# Patient Record
Sex: Male | Born: 1955 | Hispanic: Refuse to answer | Marital: Married | Smoking: Never smoker
Health system: Southern US, Community
[De-identification: ages and names within clinical notes are randomized; demographics above are authoritative.]

## PROBLEM LIST (undated history)

## (undated) DIAGNOSIS — E785 Hyperlipidemia, unspecified: Secondary | ICD-10-CM

## (undated) DIAGNOSIS — I10 Essential (primary) hypertension: Secondary | ICD-10-CM

## (undated) HISTORY — DX: Essential (primary) hypertension: I10

## (undated) HISTORY — PX: EXPLORATORY LAPAROTOMY: SUR591

## (undated) HISTORY — DX: Hyperlipidemia, unspecified: E78.5

## (undated) HISTORY — PX: REFRACTIVE SURGERY: SHX103

---

## 2002-12-26 ENCOUNTER — Encounter: Payer: Self-pay | Admitting: Family Medicine

## 2003-01-11 ENCOUNTER — Encounter: Payer: Self-pay | Admitting: Family Medicine

## 2006-08-05 ENCOUNTER — Ambulatory Visit: Payer: Self-pay | Admitting: Family Medicine

## 2006-09-05 ENCOUNTER — Ambulatory Visit: Payer: Self-pay | Admitting: Family Medicine

## 2006-09-05 LAB — CONVERTED CEMR LAB
Chol/HDL Ratio, serum: 4.8
Cholesterol: 172 mg/dL (ref 0–200)
Glucose, Bld: 102 mg/dL — ABNORMAL HIGH (ref 70–99)
HDL: 36.1 mg/dL — ABNORMAL LOW (ref >39.0)
LDL Cholesterol: 114 mg/dL — ABNORMAL HIGH (ref 0–99)
PSA: 1.7 ng/mL (ref 0.10–4.00)
Triglyceride fasting, serum: 107 mg/dL (ref 0–149)
VLDL: 21 mg/dL (ref 0–40)

## 2006-09-27 ENCOUNTER — Ambulatory Visit: Payer: Self-pay | Admitting: Internal Medicine

## 2006-11-02 LAB — HM COLONOSCOPY: HM Colonoscopy: NORMAL

## 2006-11-14 ENCOUNTER — Ambulatory Visit: Payer: Self-pay | Admitting: Internal Medicine

## 2006-12-12 ENCOUNTER — Ambulatory Visit: Payer: Self-pay | Admitting: Family Medicine

## 2006-12-12 LAB — CONVERTED CEMR LAB: Glucose, Bld: 103 mg/dL — ABNORMAL HIGH (ref 70–99)

## 2007-02-08 ENCOUNTER — Ambulatory Visit: Payer: Self-pay | Admitting: Family Medicine

## 2007-03-07 DIAGNOSIS — E785 Hyperlipidemia, unspecified: Secondary | ICD-10-CM | POA: Insufficient documentation

## 2007-03-15 ENCOUNTER — Ambulatory Visit: Payer: Self-pay | Admitting: Family Medicine

## 2007-03-15 LAB — CONVERTED CEMR LAB
ALT: 32 units/L (ref 0–40)
AST: 26 units/L (ref 0–37)
Albumin: 4.3 g/dL (ref 3.5–5.2)
Alkaline Phosphatase: 79 units/L (ref 39–117)
BUN: 10 mg/dL (ref 6–23)
Basophils Absolute: 0 10*3/uL (ref 0.0–0.1)
Basophils Relative: 0.2 % (ref 0.0–1.0)
Bilirubin, Direct: 0.1 mg/dL (ref 0.0–0.3)
CO2: 27 meq/L (ref 19–32)
Calcium: 9.5 mg/dL (ref 8.4–10.5)
Chloride: 107 meq/L (ref 96–112)
Creatinine, Ser: 0.9 mg/dL (ref 0.4–1.5)
Eosinophils Absolute: 0.1 10*3/uL (ref 0.0–0.6)
Eosinophils Relative: 2.2 % (ref 0.0–5.0)
GFR calc Af Amer: 116 mL/min
GFR calc non Af Amer: 96 mL/min
Glucose, Bld: 99 mg/dL (ref 70–99)
HCT: 43.6 % (ref 39.0–52.0)
Hemoglobin: 15.5 g/dL (ref 13.0–17.0)
Lymphocytes Relative: 30.4 % (ref 12.0–46.0)
MCHC: 35.5 g/dL (ref 30.0–36.0)
MCV: 91.2 fL (ref 78.0–100.0)
Magnesium: 2.3 mg/dL (ref 1.5–2.5)
Monocytes Absolute: 0.6 10*3/uL (ref 0.2–0.7)
Monocytes Relative: 8.9 % (ref 3.0–11.0)
Neutro Abs: 4 10*3/uL (ref 1.4–7.7)
Neutrophils Relative %: 58.3 % (ref 43.0–77.0)
Platelets: 235 10*3/uL (ref 150–400)
Potassium: 3.7 meq/L (ref 3.5–5.1)
RBC: 4.78 M/uL (ref 4.22–5.81)
RDW: 11.6 % (ref 11.5–14.6)
Sodium: 140 meq/L (ref 135–145)
TSH: 1.77 microintl units/mL (ref 0.35–5.50)
Total Bilirubin: 1 mg/dL (ref 0.3–1.2)
Total Protein: 7.5 g/dL (ref 6.0–8.3)
WBC: 6.8 10*3/uL (ref 4.5–10.5)

## 2007-06-14 ENCOUNTER — Encounter (INDEPENDENT_AMBULATORY_CARE_PROVIDER_SITE_OTHER): Payer: Self-pay | Admitting: Family Medicine

## 2007-10-09 ENCOUNTER — Telehealth (INDEPENDENT_AMBULATORY_CARE_PROVIDER_SITE_OTHER): Payer: Self-pay | Admitting: Family Medicine

## 2008-03-15 ENCOUNTER — Ambulatory Visit: Payer: Self-pay | Admitting: Internal Medicine

## 2008-03-15 DIAGNOSIS — S99919A Unspecified injury of unspecified ankle, initial encounter: Secondary | ICD-10-CM

## 2008-03-15 DIAGNOSIS — S8990XA Unspecified injury of unspecified lower leg, initial encounter: Secondary | ICD-10-CM | POA: Insufficient documentation

## 2008-03-15 DIAGNOSIS — S99929A Unspecified injury of unspecified foot, initial encounter: Secondary | ICD-10-CM

## 2008-07-02 ENCOUNTER — Ambulatory Visit: Payer: Self-pay | Admitting: Family Medicine

## 2008-07-02 DIAGNOSIS — I10 Essential (primary) hypertension: Secondary | ICD-10-CM | POA: Insufficient documentation

## 2008-07-15 ENCOUNTER — Encounter (INDEPENDENT_AMBULATORY_CARE_PROVIDER_SITE_OTHER): Payer: Self-pay | Admitting: *Deleted

## 2008-07-15 LAB — CONVERTED CEMR LAB
BUN: 18 mg/dL (ref 6–23)
CO2: 31 meq/L (ref 19–32)
Calcium: 9.7 mg/dL (ref 8.4–10.5)
Chloride: 109 meq/L (ref 96–112)
Creatinine, Ser: 0.9 mg/dL (ref 0.4–1.5)
GFR calc Af Amer: 115 mL/min
GFR calc non Af Amer: 95 mL/min
Glucose, Bld: 103 mg/dL — ABNORMAL HIGH (ref 70–99)
Potassium: 4.3 meq/L (ref 3.5–5.1)
Sodium: 142 meq/L (ref 135–145)

## 2008-07-18 ENCOUNTER — Ambulatory Visit: Payer: Self-pay | Admitting: Family Medicine

## 2008-07-29 LAB — CONVERTED CEMR LAB
ALT: 35 units/L (ref 0–53)
AST: 28 units/L (ref 0–37)
Alkaline Phosphatase: 71 units/L (ref 39–117)
Bilirubin, Direct: 0.1 mg/dL (ref 0.0–0.3)
CO2: 32 meq/L (ref 19–32)
Chloride: 104 meq/L (ref 96–112)
GFR calc non Af Amer: 84 mL/min
LDL Cholesterol: 107 mg/dL — ABNORMAL HIGH (ref 0–99)
Potassium: 4.6 meq/L (ref 3.5–5.1)
Total Bilirubin: 1.1 mg/dL (ref 0.3–1.2)
Total CHOL/HDL Ratio: 6.1
Triglycerides: 173 mg/dL — ABNORMAL HIGH (ref 0–149)

## 2008-07-30 ENCOUNTER — Encounter (INDEPENDENT_AMBULATORY_CARE_PROVIDER_SITE_OTHER): Payer: Self-pay | Admitting: *Deleted

## 2009-08-29 ENCOUNTER — Ambulatory Visit: Payer: Self-pay | Admitting: Family Medicine

## 2009-09-10 ENCOUNTER — Telehealth (INDEPENDENT_AMBULATORY_CARE_PROVIDER_SITE_OTHER): Payer: Self-pay | Admitting: *Deleted

## 2009-09-10 LAB — CONVERTED CEMR LAB
ALT: 39 units/L (ref 0–53)
AST: 29 units/L (ref 0–37)
BUN: 13 mg/dL (ref 6–23)
Basophils Relative: 0.4 % (ref 0.0–3.0)
Bilirubin, Direct: 0 mg/dL (ref 0.0–0.3)
Chloride: 103 meq/L (ref 96–112)
Eosinophils Relative: 1.5 % (ref 0.0–5.0)
GFR calc non Af Amer: 83.71 mL/min (ref >60)
HCT: 46.8 % (ref 39.0–52.0)
LDL Cholesterol: 114 mg/dL — ABNORMAL HIGH (ref 0–99)
Lymphs Abs: 2.3 10*3/uL (ref 0.7–4.0)
Monocytes Relative: 8.5 % (ref 3.0–12.0)
Platelets: 218 10*3/uL (ref 150.0–400.0)
Potassium: 4.6 meq/L (ref 3.5–5.1)
RBC: 4.9 M/uL (ref 4.22–5.81)
Sodium: 140 meq/L (ref 135–145)
Total Bilirubin: 1.1 mg/dL (ref 0.3–1.2)
Total CHOL/HDL Ratio: 6
Total Protein: 8.3 g/dL (ref 6.0–8.3)
VLDL: 37.6 mg/dL (ref 0.0–40.0)
WBC: 8.5 10*3/uL (ref 4.5–10.5)

## 2010-01-21 ENCOUNTER — Telehealth (INDEPENDENT_AMBULATORY_CARE_PROVIDER_SITE_OTHER): Payer: Self-pay | Admitting: *Deleted

## 2010-11-06 ENCOUNTER — Other Ambulatory Visit: Payer: Self-pay | Admitting: Family Medicine

## 2010-11-06 ENCOUNTER — Encounter: Payer: Self-pay | Admitting: Family Medicine

## 2010-11-06 ENCOUNTER — Ambulatory Visit
Admission: RE | Admit: 2010-11-06 | Discharge: 2010-11-06 | Payer: Self-pay | Source: Home / Self Care | Attending: Family Medicine | Admitting: Family Medicine

## 2010-11-06 DIAGNOSIS — N529 Male erectile dysfunction, unspecified: Secondary | ICD-10-CM | POA: Insufficient documentation

## 2010-11-06 LAB — CBC WITH DIFFERENTIAL/PLATELET
Basophils Absolute: 0 10*3/uL (ref 0.0–0.1)
Basophils Relative: 0.5 % (ref 0.0–3.0)
Eosinophils Absolute: 0.1 10*3/uL (ref 0.0–0.7)
Eosinophils Relative: 1.5 % (ref 0.0–5.0)
HCT: 46.3 % (ref 39.0–52.0)
Hemoglobin: 16.1 g/dL (ref 13.0–17.0)
Lymphocytes Relative: 26.9 % (ref 12.0–46.0)
Lymphs Abs: 2.3 10*3/uL (ref 0.7–4.0)
MCHC: 34.9 g/dL (ref 30.0–36.0)
MCV: 94.8 fl (ref 78.0–100.0)
Monocytes Absolute: 0.6 10*3/uL (ref 0.1–1.0)
Monocytes Relative: 7.1 % (ref 3.0–12.0)
Neutro Abs: 5.4 10*3/uL (ref 1.4–7.7)
Neutrophils Relative %: 64 % (ref 43.0–77.0)
Platelets: 211 10*3/uL (ref 150.0–400.0)
RBC: 4.88 Mil/uL (ref 4.22–5.81)
RDW: 13.2 % (ref 11.5–14.6)
WBC: 8.4 10*3/uL (ref 4.5–10.5)

## 2010-11-06 LAB — HEPATIC FUNCTION PANEL
ALT: 31 U/L (ref 0–53)
AST: 27 U/L (ref 0–37)
Albumin: 4.5 g/dL (ref 3.5–5.2)
Alkaline Phosphatase: 76 U/L (ref 39–117)
Bilirubin, Direct: 0.1 mg/dL (ref 0.0–0.3)
Total Bilirubin: 1 mg/dL (ref 0.3–1.2)
Total Protein: 7.7 g/dL (ref 6.0–8.3)

## 2010-11-06 LAB — CONVERTED CEMR LAB
Glucose, Urine, Semiquant: NEGATIVE
Protein, U semiquant: NEGATIVE
Urobilinogen, UA: NEGATIVE
WBC Urine, dipstick: NEGATIVE

## 2010-11-06 LAB — LIPID PANEL
Cholesterol: 191 mg/dL (ref 0–200)
HDL: 36 mg/dL — ABNORMAL LOW (ref >39.00)
LDL Cholesterol: 132 mg/dL — ABNORMAL HIGH (ref 0–99)
Total CHOL/HDL Ratio: 5
Triglycerides: 113 mg/dL (ref 0.0–149.0)
VLDL: 22.6 mg/dL (ref 0.0–40.0)

## 2010-11-06 LAB — BASIC METABOLIC PANEL
BUN: 12 mg/dL (ref 6–23)
CO2: 29 mEq/L (ref 19–32)
Calcium: 9.5 mg/dL (ref 8.4–10.5)
Chloride: 103 mEq/L (ref 96–112)
Creatinine, Ser: 0.9 mg/dL (ref 0.4–1.5)
GFR: 97.85 mL/min (ref >60.00)
Glucose, Bld: 90 mg/dL (ref 70–99)
Potassium: 4.5 mEq/L (ref 3.5–5.1)
Sodium: 139 mEq/L (ref 135–145)

## 2010-11-06 LAB — PSA: PSA: 1.83 ng/mL (ref 0.10–4.00)

## 2010-11-06 LAB — TSH: TSH: 1.79 u[IU]/mL (ref 0.35–5.50)

## 2010-11-07 ENCOUNTER — Encounter: Payer: Self-pay | Admitting: Family Medicine

## 2010-11-10 LAB — CONVERTED CEMR LAB: Testosterone-% Free: 2.5 % (ref 1.6–2.9)

## 2010-11-17 NOTE — Progress Notes (Signed)
Summary: Need copy of bloodwork  Phone Note Call from Patient Call back at Home Phone [phone]   Caller: Joseph [name] Call For: [name] DO Summary of Call: Wants a copy of bloodwork.  Initial call taken by: Barnie Mort,  January 21, 2010 8:45 AM  Follow-up for Phone Call        Placed up front for pick up. Follow-up by: Harold Barban,  January 21, 2010 8:53 AM

## 2010-11-19 NOTE — Assessment & Plan Note (Signed)
Summary: cpx//pt will be fasting//lch   Vital Signs:  Patient profile:   55 year old male Height:      72 inches Weight:      261.0 pounds BMI:     35.53 Pulse rate:   88 / minute Pulse rhythm:   regular BP sitting:   120 / 84  (right arm) Cuff size:   large  Vitals Entered By: Almeta Monas CMA Duncan Dull) (November 06, 2010 9:56 AM) CC: CPX/'fasting   History of Present Illness: Pt here for cpe.    Hyperlipidemia follow-up      This is a 55 year old man who presents for Hyperlipidemia follow-up.  The patient denies muscle aches, GI upset, abdominal pain, flushing, itching, constipation, diarrhea, and fatigue.  The patient denies the following symptoms: chest pain/pressure, exercise intolerance, dypsnea, palpitations, syncope, and pedal edema.  Compliance with medications (by patient report) has been near 100%.  Dietary compliance has been good.  The patient reports exercising daily.  Adjunctive measures currently used by the patient include ASA.    Hypertension follow-up      The patient also presents for Hypertension follow-up.  The patient denies lightheadedness, urinary frequency, headaches, edema, impotence, rash, and fatigue.  The patient denies the following associated symptoms: chest pain, chest pressure, exercise intolerance, dyspnea, palpitations, syncope, leg edema, and pedal edema.  Compliance with medications (by patient report) has been near 100%.  The patient reports that dietary compliance has been good.  The patient reports exercising daily.  Adjunctive measures currently used by the patient include salt restriction.    Preventive Screening-Counseling & Management  Alcohol-Tobacco     Alcohol drinks/day: 2     Alcohol type: WINE,BEER,MIXED DRINKS     Smoking Status: never  Caffeine-Diet-Exercise     Does Patient Exercise: yes     Type of exercise: cardio, weights     Exercise (avg: min/session): 30-60     Times/week: 5  Hep-HIV-STD-Contraception     Dental  Visit-last 6 months no     Dental Care Counseling: not indicated; dental care within six months      Sexual History:  currently monogamous.        Drug Use:  no.    Current Medications (verified): 1)  Lisinopril 20 Mg Tabs (Lisinopril) .Marland Kitchen.. 1 By Mouth Once Daily 2)  Cialis 5 Mg Tabs (Tadalafil) .Marland Kitchen.. 1 By Mouth Daily.  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 Family History of Colon CA 1st degree relative  81s Father-- pancreatic cancer Family History of Arthritis  Social History: Reviewed history and no changes required. 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 Eyes:  Denies  blurring, discharge, double vision, eye irritation, eye pain, halos, itching, light sensitivity, red eye, vision loss-1 eye, and vision loss-both eyes. ENT:  Denies decreased hearing, difficulty swallowing, ear discharge, earache, hoarseness, nasal congestion, nosebleeds, postnasal drainage, ringing in ears, sinus pressure, and  sore throat. CV:  Denies bluish discoloration of lips or nails, chest pain or discomfort, difficulty breathing at night, difficulty breathing while lying down, fainting, fatigue, leg cramps with exertion, lightheadness, near fainting, palpitations, shortness of breath with exertion, swelling of feet, swelling of hands, and weight gain. Resp:  Denies chest discomfort, chest pain with inspiration, cough, coughing up blood, excessive snoring, hypersomnolence, morning headaches, pleuritic, shortness of breath, sputum productive, and wheezing. GI:  Denies abdominal pain, bloody stools, change in bowel habits, constipation, dark tarry stools, diarrhea, excessive appetite, gas, hemorrhoids, indigestion, loss of appetite, nausea, vomiting, vomiting blood, and yellowish skin color. GU:  Denies decreased libido, discharge, dysuria, erectile dysfunction, genital sores, hematuria, incontinence, nocturia, urinary frequency, and urinary hesitancy. MS:  Denies joint pain, joint redness, joint swelling, loss of strength, low back pain, mid back pain, muscle aches, muscle , cramps, muscle weakness, stiffness, and thoracic pain. Derm:  Denies changes in color of skin, changes in nail beds, dryness, excessive perspiration, flushing, hair loss, insect bite(s), itching, lesion(s), poor wound healing, and rash. Neuro:  Denies brief paralysis, difficulty with concentration, disturbances in coordination, falling down, headaches, inability to speak, memory loss, numbness, poor balance, seizures, sensation of room spinning, tingling, tremors, visual disturbances, and weakness. Psych:  Denies alternate hallucination ( auditory/visual), anxiety, depression, easily angered, easily tearful, irritability, mental problems, panic attacks, sense of great danger, suicidal thoughts/plans, thoughts of violence, unusual visions or sounds, and thoughts /plans of harming others. Endo:  Denies cold intolerance, excessive hunger, excessive thirst,  excessive urination, heat intolerance, polyuria, and weight change. Heme:  Denies abnormal bruising, bleeding, enlarge lymph nodes, fevers, pallor, and skin discoloration. Allergy:  Denies hives or rash, itching eyes, persistent infections, seasonal allergies, and sneezing.  Physical Exam  General:  Well-developed,well-nourished,in no acute distress; alert,appropriate and cooperative throughout examination Head:  Normocephalic and atraumatic without obvious abnormalities. No apparent alopecia or balding. Eyes:  pupils equal, pupils round, pupils reactive to light, and no injection.   Ears:  External ear exam shows no significant lesions or deformities.  Otoscopic examination reveals clear canals, tympanic membranes are intact bilaterally without bulging, retraction, inflammation or discharge. Hearing is grossly normal bilaterally. Nose:  External nasal examination shows no deformity or inflammation. Nasal mucosa are pink and moist without lesions or exudates. Mouth:  Oral mucosa and oropharynx without lesions or exudates.  Teeth in good repair. Neck:  No deformities, masses, or tenderness noted.no carotid bruits.   Chest Wall:  No deformities, masses, tenderness or gynecomastia noted. Lungs:  Normal respiratory effort, chest expands symmetrically. Lungs are clear to auscultation, no crackles or wheezes. Heart:  normal rate and no murmur.   Abdomen:  Bowel sounds positive,abdomen soft and non-tender without masses, organomegaly or hernias noted. Rectal:  No external abnormalities noted. Normal sphincter tone. No rectal masses or tenderness.  heme neg brown stool Genitalia:  Testes bilaterally descended without nodularity, tenderness or masses. No scrotal masses or lesions. No penis lesions or urethral discharge. Prostate:  Prostate gland firm and smooth, no enlargement, nodularity, tenderness, mass, asymmetry or induration. Msk:  No deformity or scoliosis noted of thoracic or lumbar spine.     Pulses:  R and L carotid,radial,femoral,dorsalis pedis and posterior tibial pulses are full and equal  bilaterally Extremities:  No clubbing, cyanosis, edema, or deformity noted with normal full range of motion of all joints.   Neurologic:  No cranial nerve deficits noted. Station and gait are normal. Plantar reflexes are down-going bilaterally. DTRs are symmetrical throughout. Sensory, motor and coordinative functions appear intact. Skin:  Intact without suspicious lesions or rashes Cervical Nodes:  No lymphadenopathy noted Axillary Nodes:  No palpable lymphadenopathy Psych:  Cognition and judgment appear intact. Alert and cooperative with normal attention span and concentration. No apparent delusions, illusions, hallucinations   Impression & Recommendations:  Problem # 1:  PREVENTIVE HEALTH CARE (ICD-V70.0) ghm utd  Orders: Venipuncture (16109) TLB-Lipid Panel (80061-LIPID) TLB-BMP (Basic Metabolic Panel-BMET) (80048-METABOL) TLB-CBC Platelet - w/Differential (85025-CBCD) TLB-Hepatic/Liver Function Pnl (80076-HEPATIC) TLB-TSH (Thyroid Stimulating Hormone) (84443-TSH) TLB-PSA (Prostate Specific Antigen) (84153-PSA) T- * Misc. Laboratory test [id number]) Specimen Handling (09811) EKG w/ Interpretation (93000)  Reviewed preventive care protocols, scheduled due services, and updated immunizations.  Problem # 2:  ERECTILE DYSFUNCTION, ORGANIC (ICD-607.84)  His updated medication list for this problem includes:    Cialis 5 Mg Tabs (Tadalafil) .Marland Kitchen... 1 by mouth daily.  Orders: Venipuncture (91478) TLB-Lipid Panel (80061-LIPID) TLB-BMP (Basic Metabolic Panel-BMET) (80048-METABOL) TLB-CBC Platelet - w/Differential (85025-CBCD) TLB-Hepatic/Liver Function Pnl (80076-HEPATIC) TLB-TSH (Thyroid Stimulating Hormone) (84443-TSH) TLB-PSA (Prostate Specific Antigen) (84153-PSA) T- * Misc. Laboratory test [id number]) Specimen Handling (13086)  Discussed proper use of medications, as well as side  effects.   Problem # 3:  HYPERTENSION (ICD-401.9)  His updated medication list for this problem includes:    Lisinopril 20 Mg Tabs (Lisinopril) .Marland Kitchen... 1 by mouth once daily  Orders: Venipuncture (57846) TLB-Lipid Panel (80061-LIPID) TLB-BMP (Basic Metabolic Panel-BMET) (80048-METABOL) TLB-CBC Platelet - w/Differential (85025-CBCD) TLB-Hepatic/Liver Function Pnl (80076-HEPATIC) TLB-TSH (Thyroid Stimulating Hormone) (84443-TSH) TLB-PSA (Prostate Specific Antigen) (84153-PSA) T- * Misc. Laboratory test [phone]) Specimen Handling (28413)  BP today: 120/84 Prior BP: 122/82 (08/29/2009)  Labs Reviewed: K+: 4.6 (08/29/2009) Creat: : 1.0 (08/29/2009)   Chol: 182 (08/29/2009)   HDL: 30.50 (08/29/2009)   LDL: 114 (08/29/2009)   TG: 188.0 (08/29/2009)  Problem # 4:  HYPERLIPIDEMIA (ICD-272.4)  The following medications were removed from the medication list:    Antara 130 Mg Caps (Fenofibrate micronized) .Marland Kitchen... 1 by mouth at bedtime.  Orders: Venipuncture (24401) TLB-Lipid Panel (80061-LIPID) TLB-BMP (Basic Metabolic Panel-BMET) (80048-METABOL) TLB-CBC Platelet - w/Differential (85025-CBCD) TLB-Hepatic/Liver Function Pnl (80076-HEPATIC) TLB-TSH (Thyroid Stimulating Hormone) (84443-TSH) TLB-PSA (Prostate Specific Antigen) (84153-PSA) T- * Misc. Laboratory test [phone]) Specimen Handling (36644)  Labs Reviewed: SGOT: 29 (08/29/2009)   SGPT: 39 (08/29/2009)   HDL:30.50 (08/29/2009), 27.8 (07/18/2008)  LDL:114 (08/29/2009), 107 (07/18/2008)  Chol:182 (08/29/2009), 169 (07/18/2008)  Trig:188.0 (08/29/2009), 173 (07/18/2008)  Complete Medication List: 1)  Lisinopril 20 Mg Tabs (Lisinopril) .Marland Kitchen.. 1 by mouth once daily 2)  Cialis 5 Mg Tabs (Tadalafil) .Marland Kitchen.. 1 by mouth daily.  Other Orders: Tdap => 7yrs IM (03474) Admin 1st Vaccine (25956) UA Dipstick W/ Micro (manual) (81000) T-Culture, Urine (38756-43329)   Orders Added: 1)  Tdap => 56yrs IM [90715] 2)  Admin 1st Vaccine [90471] 3)   Venipuncture [36415] 4)  TLB-Lipid Panel [80061-LIPID] 5)  TLB-BMP (Basic Metabolic Panel-BMET) [80048-METABOL] 6)  TLB-CBC Platelet - w/Differential [85025-CBCD] 7)  TLB-Hepatic/Liver Function Pnl [80076-HEPATIC] 8)  TLB-TSH (Thyroid Stimulating Hormone) [84443-TSH] 9)  TLB-PSA (Prostate Specific Antigen) [84153-PSA] 10)  T- * Misc. Laboratory test [phone] 11)  Specimen Handling [99000] 12)  UA Dipstick W/ Micro (manual) [81000] 13)  T-Culture, Urine [16606-30160] 14)  Est. Patient 40-64 years [  99396] 15)  EKG w/ Interpretation [93000]   Immunizations Administered:  Tetanus Vaccine:    Vaccine Type: Tdap    Site: left deltoid    Mfr: Merck    Dose: 0.5 ml    Route: IM    Given by: Almeta Monas CMA (AAMA)    Exp. Date: 08/06/2012    Lot #: [id number]    VIS given: 09/04/08 version given November 06, 2010.   Immunizations Administered:  Tetanus Vaccine:    Vaccine Type: Tdap    Site: left deltoid    Mfr: Merck    Dose: 0.5 ml    Route: IM    Given by: Almeta Monas CMA (AAMA)    Exp. Date: 08/06/2012    Lot #: [id number]    VIS given: 09/04/08 version given November 06, 2010.   Colonoscopy Result Date:  11/02/2006 Colonoscopy Result:  normal Colonoscopy Next Due:  10 yr  Laboratory Results   Urine Tests   Date/Time Reported: November 06, 2010 10:56 AM   Routine Urinalysis   Color: yellow Appearance: Clear Glucose: negative   (Normal Range: Negative) Bilirubin: negative   (Normal Range: Negative) Ketone: negative   (Normal Range: Negative) Spec. Gravity: <1.005   (Normal Range: 1.003-1.035) Blood: trace-intact   (Normal Range: Negative) pH: 6.5   (Normal Range: 5.0-8.0) Protein: negative   (Normal Range: Negative) Urobilinogen: negative   (Normal Range: 0-1) Nitrite: negative   (Normal Range: Negative) Leukocyte Esterace: negative   (Normal Range: Negative)    Comments: Floydene Flock  November 06, 2010 10:56 AM cx sent

## 2010-11-24 ENCOUNTER — Encounter (INDEPENDENT_AMBULATORY_CARE_PROVIDER_SITE_OTHER): Payer: Self-pay | Admitting: *Deleted

## 2010-11-25 ENCOUNTER — Encounter (INDEPENDENT_AMBULATORY_CARE_PROVIDER_SITE_OTHER): Payer: Self-pay | Admitting: *Deleted

## 2010-11-25 ENCOUNTER — Other Ambulatory Visit: Payer: Self-pay

## 2010-11-25 ENCOUNTER — Other Ambulatory Visit: Payer: Self-pay | Admitting: Family Medicine

## 2010-11-25 DIAGNOSIS — Z1211 Encounter for screening for malignant neoplasm of colon: Secondary | ICD-10-CM

## 2010-11-25 LAB — FECAL OCCULT BLOOD, IMMUNOCHEMICAL: Fecal Occult Bld: NEGATIVE

## 2010-12-03 NOTE — Letter (Signed)
Summary: Medication list/[facility]  Medication list/[facility]   Imported By: Sherian Rein 11/24/2010 15:18:56  _____________________________________________________________________  External Attachment:    Type:   Image     Comment:   External Document

## 2010-12-03 NOTE — Letter (Signed)
Summary: [facility]: Immunoassay Fecal Occult Blood (iFOB) Order Form  [facility] [place]  [address] [place], [place] [address]   Phone: [phone]  Fax: [phone]      [facility]: Immunoassay Fecal Occult Blood (iFOB) Order Form   November 24, 2010 MRN: [id number]   Joseph [name] Apr 01, 1956   Physicican Name: DR.LOWNE  Diagnosis Code: V56.71      Almeta Monas CMA (AAMA)

## 2010-12-03 NOTE — Letter (Signed)
Summary: [facility]   Imported By: Sherian Rein 11/24/2010 15:16:48  _____________________________________________________________________  External Attachment:    Type:   Image     Comment:   External Document

## 2010-12-03 NOTE — Letter (Signed)
Summary: [facility]   Imported By: Sherian Rein 11/24/2010 15:17:44  _____________________________________________________________________  External Attachment:    Type:   Image     Comment:   External Document

## 2010-12-07 ENCOUNTER — Other Ambulatory Visit: Payer: Self-pay | Admitting: Family Medicine

## 2010-12-07 ENCOUNTER — Observation Stay (HOSPITAL_COMMUNITY)
Admission: EM | Admit: 2010-12-07 | Discharge: 2010-12-08 | DRG: 883 | Disposition: A | Discharge status: Home / Self Care | Payer: BC Managed Care – PPO | Attending: Surgery | Admitting: Surgery

## 2010-12-07 ENCOUNTER — Encounter: Payer: Self-pay | Admitting: Family Medicine

## 2010-12-07 ENCOUNTER — Ambulatory Visit (INDEPENDENT_AMBULATORY_CARE_PROVIDER_SITE_OTHER)
Admission: RE | Admit: 2010-12-07 | Discharge: 2010-12-07 | Disposition: A | Discharge status: Home / Self Care | Payer: BC Managed Care – PPO | Source: Ambulatory Visit | Attending: Family Medicine | Admitting: Family Medicine

## 2010-12-07 ENCOUNTER — Ambulatory Visit (INDEPENDENT_AMBULATORY_CARE_PROVIDER_SITE_OTHER): Payer: BC Managed Care – PPO | Admitting: Family Medicine

## 2010-12-07 ENCOUNTER — Other Ambulatory Visit: Payer: Self-pay | Admitting: Surgery

## 2010-12-07 DIAGNOSIS — R1031 Right lower quadrant pain: Secondary | ICD-10-CM | POA: Insufficient documentation

## 2010-12-07 DIAGNOSIS — Z79899 Other long term (current) drug therapy: Secondary | ICD-10-CM | POA: Insufficient documentation

## 2010-12-07 DIAGNOSIS — I10 Essential (primary) hypertension: Secondary | ICD-10-CM | POA: Diagnosis present

## 2010-12-07 DIAGNOSIS — R319 Hematuria, unspecified: Secondary | ICD-10-CM

## 2010-12-07 DIAGNOSIS — E785 Hyperlipidemia, unspecified: Secondary | ICD-10-CM | POA: Diagnosis present

## 2010-12-07 DIAGNOSIS — K3580 Unspecified acute appendicitis: Principal | ICD-10-CM | POA: Diagnosis present

## 2010-12-07 LAB — CONVERTED CEMR LAB
HCT: 49.3 % (ref 39.0–52.0)
Hemoglobin: 15.5 g/dL (ref 13.0–17.0)
Lymphs Abs: 2.3 10*3/uL (ref 0.7–4.0)
MCV: 94.6 fL (ref 78.0–100.0)
Monocytes Absolute: 0.6 10*3/uL (ref 0.1–1.0)
Platelets: 228 10*3/uL (ref 150–400)
Protein, U semiquant: 100
Specific Gravity, Urine: 1.025
WBC: 8.9 10*3/uL (ref 4.0–10.5)
pH: 5

## 2010-12-07 LAB — PROTIME-INR: Prothrombin Time: 14.9 seconds (ref 11.6–15.2)

## 2010-12-07 LAB — HEPATIC FUNCTION PANEL
Albumin: 4.3 g/dL (ref 3.5–5.2)
Total Protein: 7.5 g/dL (ref 6.0–8.3)

## 2010-12-07 LAB — BASIC METABOLIC PANEL
CO2: 29 mEq/L (ref 19–32)
Calcium: 9.6 mg/dL (ref 8.4–10.5)
Creatinine, Ser: 1 mg/dL (ref 0.4–1.5)
Glucose, Bld: 83 mg/dL (ref 70–99)

## 2010-12-07 LAB — LIPASE: Lipase: 19 U/L (ref 11.0–59.0)

## 2010-12-07 MED ORDER — IOHEXOL 300 MG/ML  SOLN
100.0000 mL | Freq: Once | INTRAMUSCULAR | Status: AC | PRN
  Administered 2010-12-07: 100 mL via INTRAVENOUS

## 2010-12-15 NOTE — Assessment & Plan Note (Signed)
Summary: RLQ PAIN SINCE SATURDAY/RH...   Vital Signs:  Patient profile:   55 year old male Weight:      250.2 pounds Temp:     98.6 degrees F oral BP sitting:   130 / 82  (right arm) Cuff size:   large  Vitals Entered By: Almeta Monas CMA Duncan Dull) (December 07, 2010 11:23 AM) CC: C/O RLQ PAIN X2DAYS, Abdominal Pain   History of Present Illness:       This is a 55 year old man who presents with Abdominal Pain.  The symptoms began 3 days ago.  The patient denies nausea, vomiting, diarrhea, constipation, melena, hematochezia, anorexia, and hematemesis.  The location of the pain is right lower quadrant.  The pain is described as constant and sharp.  Associated symptoms include fever.  The patient denies the following symptoms: weight loss, dysuria, chest pain, jaundice, and dark urine.  The pain is worse with food and movement.    Problems Prior to Update: 1)  Abdominal Pain Right Lower Quadrant  (ICD-789.03) 2)  Hematuria Unspecified  (ICD-599.70) 3)  Erectile Dysfunction, Organic  (ICD-607.84) 4)  Preventive Health Care  (ICD-V70.0) 5)  Family History of Colon Ca 1st Degree Relative <60  (ICD-V16.0) 6)  Hypertension  (ICD-401.9) 7)  Foot Injury, Right  (ICD-959.7) 8)  Hyperlipidemia  (ICD-272.4)  Medications Prior to Update: 1)  Lisinopril 20 Mg Tabs (Lisinopril) .Marland Kitchen.. 1 By Mouth Once Daily 2)  Cialis 5 Mg Tabs (Tadalafil) .Marland Kitchen.. 1 By Mouth Daily.  Current Medications (verified): 1)  Lisinopril 20 Mg Tabs (Lisinopril) .Marland Kitchen.. 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 Family History of Colon CA 1st degree relative  63s Father-- pancreatic cancer Family History of Arthritis  Social History: Reviewed history from 11/06/2010 and no changes required. 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   Mouth:  Oral mucosa and oropharynx without lesions or exudates.   Neck:  No deformities, masses, or tenderness noted. Lungs:  Normal respiratory effort, chest expands symmetrically. Lungs are clear to auscultation, no crackles or wheezes. Heart:  normal rate and no murmur.   Abdomen:  + guarding RLQ with rebound some tenderness RUQ as well abdominal scar(s).   Psych:  Oriented X3 and normally interactive.     Impression & Recommendations:  Problem # 1:  ABDOMINAL PAIN RIGHT LOWER QUADRANT (ICD-789.03)  Orders: Venipuncture (57846) TLB-BMP (Basic Metabolic Panel-BMET) (80048-METABOL) TLB-Hepatic/Liver Function Pnl (80076-HEPATIC) TLB-CBC Platelet - w/Differential (85025-CBCD) TLB-Amylase (82150-AMYL) TLB-Lipase (83690-LIPASE) Specimen Handling (96295) Radiology Referral (Radiology) UA Dipstick w/o Micro (manual) (28413)  Discussed symptom control with the patient.   Problem # 2:  HEMATURIA UNSPECIFIED (ICD-599.70)  Orders: T-Culture, Urine (24401-02725) UA Dipstick w/o Micro (  manual) (24401)  Discussed medication use and hematuria work-up.   Complete  Medication List: 1)  Lisinopril 20 Mg Tabs (Lisinopril) .Marland Kitchen.. 1 by mouth once daily  Patient Instructions: 1)  if symptoms worsen go to ER   Orders Added: 1)  T-Culture, Urine [02725-36644] 2)  Venipuncture [03474] 3)  TLB-BMP (Basic Metabolic Panel-BMET) [80048-METABOL] 4)  TLB-Hepatic/Liver Function Pnl [80076-HEPATIC] 5)  TLB-CBC Platelet - w/Differential [85025-CBCD] 6)  TLB-Amylase [82150-AMYL] 7)  TLB-Lipase [83690-LIPASE] 8)  Specimen Handling [99000] 9)  Radiology Referral [Radiology] 10)  Est. Patient Level IV [25956] 11)  UA Dipstick w/o Micro (manual) [81002]    Laboratory Results   Urine Tests    Routine Urinalysis   Color: yellow Appearance: Hazy Glucose: negative   (Normal Range: Negative) Bilirubin: negative   (Normal Range: Negative) Ketone: >= (160)   (Normal Range: Negative) Spec. Gravity: 1.025   (Normal Range: 1.003-1.035) Blood: large   (Normal Range: Negative) pH: 5.0   (Normal Range: 5.0-8.0) Protein: 100   (Normal Range: Negative) Urobilinogen: 1.0   (Normal Range: 0-1) Nitrite: negative   (Normal Range: Negative) Leukocyte Esterace: negative   (Normal Range: Negative)

## 2010-12-24 NOTE — H&P (Signed)
Joseph [name], Joseph [name]                 ACCOUNT NO.:  [id number]  MEDICAL RECORD NO.:  [id number]           PATIENT TYPE:  I  LOCATION:  1528                         FACILITY[facility]  PHYSICIAN:  Almond Lint, MD       DATE OF BIRTH:  1956-06-19  DATE OF ADMISSION:  12/07/2010 DATE OF DISCHARGE:                             HISTORY & PHYSICAL   CHIEF COMPLAINT:  Abdominal pain.  HISTORY OF PRESENT ILLNESS:  Joseph [name] is a 55 year old male with around 48 hours of abdominal pain. He states it came on suddenly in the right lower quadrant and he thought it was something he ate.  However, it did not get any better over the last 2 days and he went to his primary care doctor today.  She ordered scans and labs, which were positive for acute appendicitis.  He describes the pain as being worse with movement and with food, although he was able to eat some food on Sunday.  He has not had his normal appetite.  He denies jaundice, chest pain, or shortness of breath.  No nausea.  No vomiting, diarrhea, or constipation.  The only relieving factor was palm wine last night that allowed him to go to sleep.  Pain now is 5/10.  PAST MEDICAL HISTORY: 1. Hypertension. 2. Hyperlipidemia.  PAST SURGICAL HISTORY:  Exploratory laparotomy for liver laceration in high school, reconstructive surgery on the right index finger.  FAMILY HISTORY:  Pancreatic cancer in his father, first-degree relative with colon cancer and arthritis.  SOCIAL HISTORY:  No tobacco.  Once a day alcohol in the form of a glass of wine a day.  No drugs.  He is married and accompanied by his wife. 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  REVIEW OF SYSTEMS:  Negative x11 systems.  MEDICATIONS:  Lisinopril.  ALLERGIES:  None.  PHYSICAL EXAMINATION:  VITAL SIGNS:  Temperature 98.3, heart rate 92, blood pressure 125/88, respiratory rate 16, 213 pounds, sats 98% on room air. GENERAL:  Alert and oriented x3, in no acute distress. HEENT:   Normocephalic, atraumatic.  Sclerae anicteric. NECK:  Supple.  No lymphadenopathy.  No thyromegaly.  Trachea is midline. HEART:  Regular rate and rhythm without murmurs. LUNGS:  Clear bilaterally. ABDOMEN:  Soft, nondistended, tender in the right lower quadrant. Negative Rovsing sign.  Negative psoas sign.  No hernias seen. EXTREMITIES:  Warm and well perfused.  No pitting edema.  No rashes seen. PSYCH:  He is good spirits.  Normal mood and affect.   NEURO:  No gross motor or sensory deficits.  MUSCULOSKELETAL:  He is missing the distal phalanx of his right index finger.  LABORATORY DATA:  Sodium 141, potassium 4.6, chloride 103, CO2 of 29, BUN 16, creatinine 1.0, glucose 83, calcium 9.6.  LFTs normal.  Albumin 4.3.  UA positive for ketones, blood, and protein.  White count 8.9, hematocrit 49.3, platelet count of 228,000.  CT is positive for early acute appendicitis.  IMPRESSION:  Joseph [name] has acute appendicitis.  We will give him IV fluids, IV antibiotics, n.p.o. and consent him for the operating room,  lap versus open appendectomy.  Risks and benefits were described to the patient.  Dr. Ezzard Standing will be informed of the patient and will be performing the surgery.     Almond Lint, MD     FB/MEDQ  D:  12/07/2010  T:  12/08/2010  Job:  [id number]  Electronically Signed by Almond Lint MD on 12/23/2010 12:39:18 PM

## 2010-12-28 NOTE — Op Note (Signed)
Joseph [name], Joseph [name]                 ACCOUNT NO.:  [id number]  MEDICAL RECORD NO.:  [id number]           PATIENT TYPE:  E  LOCATION:  [facility]                         FACILITY:  [facility]  PHYSICIAN:  Sandria Bales. Ezzard Standing, M.D.  DATE OF BIRTH:  1956/05/28  DATE OF PROCEDURE:  12/07/2010                              OPERATIVE REPORT   PREOPERATIVE DIAGNOSIS:  Acute appendicitis.  POSTOPERATIVE DIAGNOSIS:  Acute suppurative appendicitis.  PROCEDURE:  Laparoscopic appendectomy.  SURGEON:  Sandria Bales. Ezzard Standing, M.D.  FIRST ASSISTANT:  None.  ANESTHESIA:  General endotracheal.  ESTIMATED BLOOD LOSS:  Minimal.  INDICATION FOR PROCEDURE:  This is a 55 year old white male, who is a patient Dr. [name], who has about a 2-day history of worsening abdominal pain with the CT scan evidence of acute appendicitis.  I discussed with the patient's family about proceeding with appendectomy.  I discussed the options and potential complications of appendectomy.  The potential complication of appendectomy include, but are not limited to, bleeding, open surgery, bowel resection and some other diagnosis.  OPERATIVE NOTE:  The patient was placed in the supine position and given a general endotracheal anesthetic.  He was given Invanz in the emergency room as antibiotic and then was prepped with ChloraPrep and sterilely draped.  A time-out was held and surgical checklist run.  I went through an infraumbilical incision.  Sharp dissection was carried down to the abdominal cavity.  The patient had a prior laparotomy through a right paramedian incision when he was in high school.  He had adhesions of his transverse colon up to the other source of the scar tissue, but the adhesions really did not play much of a role in getting to his cecum or appendix.  I spent only about 5-10 minutes in lysis and opening a window so I can get my trocars in.  I placed a Hasson trocar, secured it with the 0 Vicryl suture to  the umbilicus.  I placed a 5-mm trocar in the right upper quadrant and a 5 mm in the left lower quadrant.  The terminal ileum was tethered to the brim of the pelvis on the right side, but I was able to mobilize cecum up medially.  The appendix was flipped sort of up behind the cecum going towards the right lobe of the liver.  The appendix had purulence consistent with acute purulent appendicitis.  I took the mesentery appendix down using the harmonic scalpel, cut to the base appendix where I used a blue load of the 45-mm Endo-GIA stapler across the base of the appendix.  The appendix was delivered into EndoCatch bag in the liver to the umbilicus.  I then reinspected the stump of the appendix.  The staple line looked good.    I irrigated the abdomen out with about a liter of saline to get all the purulence out along the right colon gutter. The appendix was sent to pathology.  I then removed the Hasson trocar and the umbilical incision was closed with 0 Vicryl suture.  The skin trocar was closed with 5-0 Monocryl suture painted with Dermabond and sterilely dressed.  The patient tolerated the procedure well and was transported to the recovery room in good condition.  Sponge and needle counts were correct. Much   Sandria Bales. Ezzard Standing, M.D., FACS  DHN/MEDQ  D:  12/07/2010  T:  12/07/2010  Job:  [id number]  cc:   Lelon Perla, DO [address] [place], [place] [address]  Electronically Signed by Ovidio Kin M.D. on 12/28/2010 06:45:56 PM

## 2011-03-02 NOTE — Assessment & Plan Note (Signed)
[facility]                        [facility] OFFICE NOTE   NAME:Joseph [name], Joseph [name]                          MRN:          [id number]  DATE:03/15/2007                            DOB:          23-Sep-1956    REASON FOR VISIT:  Carpal tunnel follow up. Mr. Joseph [name] reports that the  bilateral night splints helped significantly when he used it, but if he  forgot a couple of days, his symptoms will recur. He does report that he  is having more discomfort on the ulnar aspect of his left hand. His  right hand is not bothering him as much.   Review of records show that the patient's blood pressure has been  borderline for a while. Today his blood pressure is also elevated at  120/90 and based on his history, he reports that he does typically run  between 80 and 90 diastolically. He has been previously advised to  consider medication therapy.   PAST MEDICAL HISTORY:  Hyperglycemia.   MEDICATIONS:  None.   OBJECTIVE:  VITAL SIGNS:  Weight 254.2 pounds, pulse 78, respiratory  rate 20, blood pressure 120/90.  GENERAL:  Pleasant male in no acute distress, answers questions  appropriately.  NECK:  Supple, no lymphadenopathy, carotid bruits, or JVD.  LUNGS:  Clear.  HEART:  Regular rate and rhythm, normal S1, S2, no murmurs, rubs, or  gallops.  EXTREMITIES:  No cyanosis, clubbing, or edema. Examination of the left  wrist is significant for no obvious deformity. No significant tenderness  to palpation. Strength was 5/5 and grip strength was also 5/5.   IMPRESSION:  1. Bilateral carpal tunnel, symptoms had improved while using the      night splints, but continues to have discomfort if he forgets to      use the splints in the evening.  2. Right distal ulnar pain. It is unlikely due to carpal tunnel.  3. Newly diagnosed hypertension.   PLAN:  1. In regards to the carpal tunnel symptoms that he previously      presented with, we will refer the patient to  neurology for further      evaluation. We will obtain an x-ray of his wrist bilaterally to      rule out any other etiology.  2. After a long discussion, the patient agreed to treatment of      hypertension with      hydrochlorothiazide 12.5 mg daily. Side effects were reviewed.  3. We will check a CBC, TSH, comprehensive metabolic profile, and      magnesium level.  4. The patient will follow up with me in 4 weeks to recheck his blood      pressure.     Leanne Chang, M.D.  Electronically Signed    LA/MedQ  DD: 03/15/2007  DT: 03/16/2007  Job #: [id number]

## 2011-07-23 ENCOUNTER — Ambulatory Visit: Payer: BC Managed Care – PPO

## 2011-09-20 ENCOUNTER — Other Ambulatory Visit: Payer: Self-pay | Admitting: Family Medicine

## 2011-09-20 MED ORDER — OSELTAMIVIR PHOSPHATE 75 MG PO CAPS: ORAL_CAPSULE | ORAL | Status: DC

## 2011-09-28 ENCOUNTER — Other Ambulatory Visit: Payer: Self-pay | Admitting: Family Medicine

## 2011-10-08 ENCOUNTER — Encounter: Payer: Self-pay | Admitting: Internal Medicine

## 2011-11-10 ENCOUNTER — Ambulatory Visit (INDEPENDENT_AMBULATORY_CARE_PROVIDER_SITE_OTHER): Payer: BC Managed Care – PPO | Admitting: Family Medicine

## 2011-11-10 ENCOUNTER — Encounter: Payer: Self-pay | Admitting: Family Medicine

## 2011-11-10 VITALS — BP 130/70 | HR 86 | Temp 98.6°F | Ht 74.0 in | Wt 262.0 lb

## 2011-11-10 DIAGNOSIS — J069 Acute upper respiratory infection, unspecified: Secondary | ICD-10-CM | POA: Insufficient documentation

## 2011-11-10 MED ORDER — BENZONATATE 200 MG PO CAPS: 200.0000 mg | ORAL_CAPSULE | Freq: Three times a day (TID) | ORAL | Status: AC | PRN

## 2011-11-10 MED ORDER — GUAIFENESIN-CODEINE 100-10 MG/5ML PO SYRP: 10.0000 mL | ORAL_SOLUTION | Freq: Three times a day (TID) | ORAL | Status: AC | PRN

## 2011-11-10 NOTE — Progress Notes (Signed)
  Subjective:    Patient ID: Joseph [name], male    DOB: Oct 29, 1955, 56 y.o.   MRN: [id number]  HPI URI- cough started Sunday.  Cough is dry.  No fevers.  + nasal congestion.  Taking Dayquil w/ some relief.  Denies facial pain/pressure, ear pain.  Mild sore throat from cough.  No known sick contacts.   Review of Systems For ROS see HPI     Objective:   Physical Exam  Vitals reviewed. Constitutional: He appears well-developed and well-nourished. No distress.  HENT:  Head: Normocephalic and atraumatic.  Right Ear: Tympanic membrane normal.  Left Ear: Tympanic membrane normal.  Nose: No mucosal edema or rhinorrhea. Right sinus exhibits no maxillary sinus tenderness and no frontal sinus tenderness. Left sinus exhibits no maxillary sinus tenderness and no frontal sinus tenderness.  Mouth/Throat: Mucous membranes are normal. No oropharyngeal exudate, posterior oropharyngeal edema or posterior oropharyngeal erythema.  Eyes: Conjunctivae and EOM are normal. Pupils are equal, round, and reactive to light.  Neck: Normal range of motion. Neck supple.  Cardiovascular: Normal rate, regular rhythm and normal heart sounds.   Pulmonary/Chest: Effort normal and breath sounds normal. No respiratory distress. He has no wheezes.       + hacking cough  Lymphadenopathy:    He has no cervical adenopathy.  Skin: Skin is warm and dry.          Assessment & Plan:

## 2011-11-10 NOTE — Assessment & Plan Note (Signed)
Pt's w/out evidence of bacterial infxn on PE.  Most likely viral.  Cough meds prn.  Reviewed supportive care and red flags that should prompt return.  Pt expressed understanding and is in agreement w/ plan.

## 2011-11-10 NOTE — Patient Instructions (Signed)
This appears to be a viral illness and should improve w/ time Start the tessalon as needed for daytime cough and the syrup at night Add Mucinex to thin your congestion Drink plenty of fluids Ibuprofen as needed for pain or fever REST! Call with any questions or concerns Hang in there!!!!

## 2011-12-17 ENCOUNTER — Encounter: Payer: Self-pay | Admitting: Family Medicine

## 2011-12-17 ENCOUNTER — Ambulatory Visit (HOSPITAL_BASED_OUTPATIENT_CLINIC_OR_DEPARTMENT_OTHER)
Admission: RE | Admit: 2011-12-17 | Discharge: 2011-12-17 | Disposition: A | Discharge status: Home / Self Care | Payer: BC Managed Care – PPO | Source: Ambulatory Visit | Attending: Family Medicine | Admitting: Family Medicine

## 2011-12-17 ENCOUNTER — Ambulatory Visit (INDEPENDENT_AMBULATORY_CARE_PROVIDER_SITE_OTHER): Payer: BC Managed Care – PPO | Admitting: Family Medicine

## 2011-12-17 VITALS — BP 120/90 | HR 95 | Temp 97.9°F | Wt 266.4 lb

## 2011-12-17 DIAGNOSIS — R05 Cough: Secondary | ICD-10-CM

## 2011-12-17 DIAGNOSIS — J40 Bronchitis, not specified as acute or chronic: Secondary | ICD-10-CM

## 2011-12-17 DIAGNOSIS — R059 Cough, unspecified: Secondary | ICD-10-CM | POA: Insufficient documentation

## 2011-12-17 MED ORDER — CLARITHROMYCIN ER 500 MG PO TB24: 1000.0000 mg | ORAL_TABLET | Freq: Every day | ORAL | Status: AC

## 2011-12-17 MED ORDER — PREDNISONE 10 MG PO TABS: ORAL_TABLET | ORAL | Status: DC

## 2011-12-17 MED ORDER — METHYLPREDNISOLONE ACETATE PF 80 MG/ML IJ SUSP
80.0000 mg | Freq: Once | INTRAMUSCULAR | Status: AC
Start: 2011-12-17 — End: 2011-12-17
  Administered 2011-12-17: 80 mg via INTRAMUSCULAR

## 2011-12-17 MED ORDER — CEFTRIAXONE SODIUM 1 G IJ SOLR
1.0000 g | Freq: Once | INTRAMUSCULAR | Status: AC
  Administered 2011-12-17: 1 g via INTRAMUSCULAR

## 2011-12-17 NOTE — Patient Instructions (Signed)

## 2011-12-17 NOTE — Progress Notes (Signed)
  Subjective:     Joseph [name] is a 56 y.o. male here for evaluation of a cough. Onset of symptoms was several weeks ago. Symptoms have been gradually worsening since that time. The cough is dry and is aggravated by exercise and reclining position. Associated symptoms include: chest pain and wheezing. Patient does not have a history of asthma. Patient does not have a history of environmental allergens. Patient has not traveled recently. Patient does not have a history of smoking. Patient has not had a previous chest x-ray. Patient has not had a PPD done.  The following portions of the patient's history were reviewed and updated as appropriate: allergies, current medications, past family history, past medical history, past social history, past surgical history and problem list.  Review of Systems Pertinent items are noted in HPI.    Objective:    Oxygen saturation 96% on room air BP 120/90  Pulse 95  Temp(Src) 97.9 F (36.6 C) (Oral)  Wt 266 lb 6.4 oz (120.838 kg)  SpO2 96% General appearance: alert, cooperative, appears stated age and no distress Ears: normal TM's and external ear canals both ears Nose: Nares normal. Septum midline. Mucosa normal. No drainage or sinus tenderness. Throat: lips, mucosa, and tongue normal; teeth and gums normal Neck: no adenopathy, no carotid bruit, no JVD, supple, symmetrical, trachea midline and thyroid not enlarged, symmetric, no tenderness/mass/nodules Lungs: rales bilaterally and wheezes bilaterally Heart: S1, S2 normal    Assessment:    Acute Bronchitis    Plan:    Antibiotics per medication orders. Antitussives per medication orders. Avoid exposure to tobacco smoke and fumes. B-agonist inhaler. Call if shortness of breath worsens, blood in sputum, change in character of cough, development of fever or chills, inability to maintain nutrition and hydration. Avoid exposure to tobacco smoke and fumes. Chest x-ray. pred taper

## 2012-01-04 ENCOUNTER — Encounter: Payer: BC Managed Care – PPO | Admitting: Family Medicine

## 2012-01-04 ENCOUNTER — Telehealth: Payer: Self-pay | Admitting: Family Medicine

## 2012-01-04 MED ORDER — LISINOPRIL 20 MG PO TABS: 20.0000 mg | ORAL_TABLET | Freq: Every day | ORAL | Status: DC

## 2012-01-04 NOTE — Telephone Encounter (Signed)
Patient is out of lisinipril & CPE was cancelled due Dr. Laury Axon being out sick. Can you send enough to get him thru til may 16 please Thanks

## 2012-03-02 ENCOUNTER — Encounter: Payer: Self-pay | Admitting: Family Medicine

## 2012-03-02 ENCOUNTER — Ambulatory Visit (INDEPENDENT_AMBULATORY_CARE_PROVIDER_SITE_OTHER): Payer: BC Managed Care – PPO | Admitting: Family Medicine

## 2012-03-02 VITALS — BP 118/84 | HR 78 | Temp 97.9°F | Ht 74.0 in | Wt 266.8 lb

## 2012-03-02 DIAGNOSIS — R319 Hematuria, unspecified: Secondary | ICD-10-CM

## 2012-03-02 DIAGNOSIS — I10 Essential (primary) hypertension: Secondary | ICD-10-CM

## 2012-03-02 DIAGNOSIS — Z0000 Encounter for general adult medical examination without abnormal findings: Secondary | ICD-10-CM

## 2012-03-02 DIAGNOSIS — E785 Hyperlipidemia, unspecified: Secondary | ICD-10-CM

## 2012-03-02 LAB — HEPATIC FUNCTION PANEL
ALT: 37 U/L (ref 0–53)
AST: 25 U/L (ref 0–37)
Albumin: 4.3 g/dL (ref 3.5–5.2)
Alkaline Phosphatase: 76 U/L (ref 39–117)
Bilirubin, Direct: 0.1 mg/dL (ref 0.0–0.3)
Total Bilirubin: 0.5 mg/dL (ref 0.3–1.2)
Total Protein: 7.3 g/dL (ref 6.0–8.3)

## 2012-03-02 LAB — CBC WITH DIFFERENTIAL/PLATELET
Basophils Absolute: 0 10*3/uL (ref 0.0–0.1)
Basophils Relative: 0.6 % (ref 0.0–3.0)
Eosinophils Absolute: 0.2 10*3/uL (ref 0.0–0.7)
Eosinophils Relative: 2.7 % (ref 0.0–5.0)
HCT: 46 % (ref 39.0–52.0)
Hemoglobin: 15.6 g/dL (ref 13.0–17.0)
Lymphocytes Relative: 28.3 % (ref 12.0–46.0)
Lymphs Abs: 2.1 10*3/uL (ref 0.7–4.0)
MCHC: 34 g/dL (ref 30.0–36.0)
MCV: 95 fl (ref 78.0–100.0)
Monocytes Absolute: 0.6 10*3/uL (ref 0.1–1.0)
Monocytes Relative: 8.2 % (ref 3.0–12.0)
Neutro Abs: 4.6 10*3/uL (ref 1.4–7.7)
Neutrophils Relative %: 60.2 % (ref 43.0–77.0)
Platelets: 198 10*3/uL (ref 150.0–400.0)
RBC: 4.84 Mil/uL (ref 4.22–5.81)
RDW: 13.5 % (ref 11.5–14.6)
WBC: 7.6 10*3/uL (ref 4.5–10.5)

## 2012-03-02 LAB — BASIC METABOLIC PANEL
BUN: 15 mg/dL (ref 6–23)
CO2: 28 mEq/L (ref 19–32)
Calcium: 9.6 mg/dL (ref 8.4–10.5)
Chloride: 103 mEq/L (ref 96–112)
Creatinine, Ser: 0.7 mg/dL (ref 0.4–1.5)
GFR: 123.08 mL/min (ref >60.00)
Glucose, Bld: 87 mg/dL (ref 70–99)
Potassium: 4.2 mEq/L (ref 3.5–5.1)
Sodium: 139 mEq/L (ref 135–145)

## 2012-03-02 LAB — POCT URINALYSIS DIPSTICK
Bilirubin, UA: NEGATIVE
Ketones, UA: NEGATIVE
Leukocytes, UA: NEGATIVE
Nitrite, UA: NEGATIVE
Protein, UA: NEGATIVE
pH, UA: 6.5

## 2012-03-02 LAB — TSH: TSH: 1.63 u[IU]/mL (ref 0.35–5.50)

## 2012-03-02 LAB — PSA: PSA: 2.3 ng/mL (ref 0.10–4.00)

## 2012-03-02 LAB — LIPID PANEL: Total CHOL/HDL Ratio: 5

## 2012-03-02 MED ORDER — LISINOPRIL 20 MG PO TABS: 20.0000 mg | ORAL_TABLET | Freq: Every day | ORAL | Status: DC

## 2012-03-02 NOTE — Progress Notes (Signed)
Subjective:    Patient ID: Joseph [name], male    DOB: [birth year], 56 y.o.   MRN: [id number]  HPI Pt here for cpe, labs.  No complaints.    Review of Systems    Review of Systems  Constitutional: Negative for activity change, appetite change and fatigue.  HENT: Negative for hearing loss, congestion, tinnitus and ear discharge.  dentist q49m Eyes: Negative for visual disturbance (see optho q1y -- had RK) Respiratory: Negative for cough, chest tightness and shortness of breath.   Cardiovascular: Negative for chest pain, palpitations and leg swelling.  Gastrointestinal: Negative for abdominal pain, diarrhea, constipation and abdominal distention.  Genitourinary: Negative for urgency, frequency, decreased urine volume and difficulty urinating.  Musculoskeletal: Negative for back pain, arthralgias and gait problem.  Skin: Negative for color change, pallor and rash.  Neurological: Negative for dizziness, light-headedness, numbness and headaches.  Hematological: Negative for adenopathy. Does not bruise/bleed easily.  Psychiatric/Behavioral: Negative for suicidal ideas, confusion, sleep disturbance, self-injury, dysphoric mood, decreased concentration and agitation.   History   Social History  . Marital Status: Married    Spouse Name: N/A    Number of Children: N/A  . Years of Education: N/A   Occupational History  . Not on file.   Social History Main Topics  . Smoking status: Never Smoker   . Smokeless tobacco: Not on file  . Alcohol Use: 1.2 oz/week    2 Glasses of wine per week     daily  . Drug Use: No  . Sexually Active: Yes -- Male partner(s)   Other Topics Concern  . Not on file   Social History Narrative   Exercise --- 3 days a week--- elliptical, bicycle, weights---30-45 min   Family History  Problem Relation Age of Onset  . Colon cancer    . Pancreatic cancer Father   . Arthritis Father   . Cancer Father     pancreatic   . Arthritis Mother   . Alzheimer's  disease Mother    Past Surgical History  Procedure Date  . Exploratory laparotomy     for lacerated liver in high school  . Refractive surgery     Objective:   Physical Exam  BP 118/84  Pulse 78  Temp(Src) 97.9 F (36.6 C) (Oral)  Ht 6\' 2"  (1.88 m)  Wt 266 lb 12.8 oz (121.02 kg)  BMI 34.26 kg/m2  SpO2 98%  General Appearance:    Alert, cooperative, no distress, appears stated age  Head:    Normocephalic, without obvious abnormality, atraumatic  Eyes:    PERRL, conjunctiva/corneas clear, EOM's intact, fundi    benign, both eyes       Ears:    Normal TM's and external ear canals, both ears  Nose:   Nares normal, septum midline, mucosa normal, no drainage   or sinus tenderness  Throat:   Lips, mucosa, and tongue normal; teeth and gums normal  Neck:   Supple, symmetrical, trachea midline, no adenopathy;       thyroid:  No enlargement/tenderness/nodules; no carotid   bruit or JVD  Back:     Symmetric, no curvature, ROM normal, no CVA tenderness  Lungs:     Clear to auscultation bilaterally, respirations unlabored  Chest wall:    No tenderness or deformity  Heart:    Regular rate and rhythm, S1 and S2 normal, no murmur, rub   or gallop  Abdomen:     Soft, non-tender, bowel sounds active all four quadrants,  no masses, no organomegaly  Genitalia:    Normal male without lesion, discharge or tenderness  Rectal:    Normal tone, normal prostate, no masses or tenderness;   guaiac negative stool  Extremities:   Extremities normal, atraumatic, no cyanosis or edema  Pulses:   2+ and symmetric all extremities  Skin:   Skin color, texture, turgor normal, no rashes or lesions  Lymph nodes:   Cervical, supraclavicular, and axillary nodes normal  Neurologic:   CNII-XII intact. Normal strength, sensation and reflexes      throughout        Assessment & Plan:  cpe-- ghm utd     Check fasting labs

## 2012-03-02 NOTE — Patient Instructions (Signed)
Preventive Care for Adults, Male A healthy lifestyle and preventative care can promote health and wellness. Preventative health guidelines for men include the following key practices:  A routine yearly physical is a good way to check with your caregiver about your health and preventative screening. It is a chance to share any concerns and updates on your health, and to receive a thorough exam.   Visit your dentist for a routine exam and preventative care every 6 months. Brush your teeth twice a day and floss once a day. Good oral hygiene prevents tooth decay and gum disease.   The frequency of eye exams is based on your age, health, family medical history, use of contact lenses, and other factors. Follow your caregiver's recommendations for frequency of eye exams.   Eat a healthy diet. Foods like vegetables, fruits, whole grains, low-fat dairy products, and lean protein foods contain the nutrients you need without too many calories. Decrease your intake of foods high in solid fats, added sugars, and salt. Eat the right amount of calories for you.Get information about a proper diet from your caregiver, if necessary.   Regular physical exercise is one of the most important things you can do for your health. Most adults should get at least 150 minutes of moderate-intensity exercise (any activity that increases your heart rate and causes you to sweat) each week. In addition, most adults need muscle-strengthening exercises on 2 or more days a week.   Maintain a healthy weight. The body mass index (BMI) is a screening tool to identify possible weight problems. It provides an estimate of body fat based on height and weight. Your caregiver can help determine your BMI, and can help you achieve or maintain a healthy weight.For adults 20 years and older:   A BMI below 18.5 is considered underweight.   A BMI of 18.5 to 24.9 is normal.   A BMI of 25 to 29.9 is considered overweight.   A BMI of 30 and above  is considered obese.   Maintain normal blood lipids and cholesterol levels by exercising and minimizing your intake of saturated fat. Eat a balanced diet with plenty of fruit and vegetables. Blood tests for lipids and cholesterol should begin at age 20 and be repeated every 5 years. If your lipid or cholesterol levels are high, you are over 50, or you are a high risk for heart disease, you may need your cholesterol levels checked more frequently.Ongoing high lipid and cholesterol levels should be treated with medicines if diet and exercise are not effective.   If you smoke, find out from your caregiver how to quit. If you do not use tobacco, do not start.   If you choose to drink alcohol, do not exceed 2 drinks per day. One drink is considered to be 12 ounces (355 mL) of beer, 5 ounces (148 mL) of wine, or 1.5 ounces (44 mL) of liquor.   Avoid use of street drugs. Do not share needles with anyone. Ask for help if you need support or instructions about stopping the use of drugs.   High blood pressure causes heart disease and increases the risk of stroke. Your blood pressure should be checked at least every 1 to 2 years. Ongoing high blood pressure should be treated with medicines, if weight loss and exercise are not effective.   If you are 45 to 54 years old, ask your caregiver if you should take aspirin to prevent heart disease.   Diabetes screening involves taking a blood   sample to check your fasting blood sugar level. This should be done once every 3 years, after age 45, if you are within normal weight and without risk factors for diabetes. Testing should be considered at a younger age or be carried out more frequently if you are overweight and have at least 1 risk factor for diabetes.   Colorectal cancer can be detected and often prevented. Most routine colorectal cancer screening begins at the age of 50 and continues through age 75. However, your caregiver may recommend screening at an earlier  age if you have risk factors for colon cancer. On a yearly basis, your caregiver may provide home test kits to check for hidden blood in the stool. Use of a small camera at the end of a tube, to directly examine the colon (sigmoidoscopy or colonoscopy), can detect the earliest forms of colorectal cancer. Talk to your caregiver about this at age 50, when routine screening begins. Direct examination of the colon should be repeated every 5 to 10 years through age 75, unless early forms of pre-cancerous polyps or small growths are found.   Hepatitis C blood testing is recommended for all people born from 1945 through 1965 and any individual with known risks for hepatitis C.   Practice safe sex. Use condoms and avoid high-risk sexual practices to reduce the spread of sexually transmitted infections (STIs). STIs include gonorrhea, chlamydia, syphilis, trichomonas, herpes, HPV, and human immunodeficiency virus (HIV). Herpes, HIV, and HPV are viral illnesses that have no cure. They can result in disability, cancer, and death.   A one-time screening for abdominal aortic aneurysm (AAA) and surgical repair of large AAAs by sound wave imaging (ultrasonography) is recommended for ages 65 to 75 years who are current or former smokers.   Healthy men should no longer receive prostate-specific antigen (PSA) blood tests as part of routine cancer screening. Consult with your caregiver about prostate cancer screening.   Testicular cancer screening is not recommended for adult males who have no symptoms. Screening includes self-exam, caregiver exam, and other screening tests. Consult with your caregiver about any symptoms you have or any concerns you have about testicular cancer.   Use sunscreen with skin protection factor (SPF) of 30 or more. Apply sunscreen liberally and repeatedly throughout the day. You should seek shade when your shadow is shorter than you. Protect yourself by wearing long sleeves, pants, a  wide-brimmed hat, and sunglasses year round, whenever you are outdoors.   Once a month, do a whole body skin exam, using a mirror to look at the skin on your back. Notify your caregiver of new moles, moles that have irregular borders, moles that are larger than a pencil eraser, or moles that have changed in shape or color.   Stay current with required immunizations.   Influenza. You need a dose every fall (or winter). The composition of the flu vaccine changes each year, so being vaccinated once is not enough.   Pneumococcal polysaccharide. You need 1 to 2 doses if you smoke cigarettes or if you have certain chronic medical conditions. You need 1 dose at age 65 (or older) if you have never been vaccinated.   Tetanus, diphtheria, pertussis (Tdap, Td). Get 1 dose of Tdap vaccine if you are younger than age 65 years, are over 65 and have contact with an infant, are a healthcare worker, or simply want to be protected from whooping cough. After that, you need a Td booster dose every 10 years. Consult your caregiver if   you have not had at least 3 tetanus and diphtheria-containing shots sometime in your life or have a deep or dirty wound.   HPV. This vaccine is recommended for males 13 through 54 years of age. This vaccine may be given to men 22 through 54 years of age who have not completed the 3 dose series. It is recommended for men through age 26 who have sex with men or whose immune system is weakened because of HIV infection, other illness, or medications. The vaccine is given in 3 doses over 6 months.   Measles, mumps, rubella (MMR). You need at least 1 dose of MMR if you were born in 1957 or later. You may also need a 2nd dose.   Meningococcal. If you are age 19 to 21 years and a first-year college student living in a residence hall, or have one of several medical conditions, you need to get vaccinated against meningococcal disease. You may also need additional booster doses.   Zoster (shingles).  If you are age 60 years or older, you should get this vaccine.   Varicella (chickenpox). If you have never had chickenpox or you were vaccinated but received only 1 dose, talk to your caregiver to find out if you need this vaccine.   Hepatitis A. You need this vaccine if you have a specific risk factor for hepatitis A virus infection, or you simply wish to be protected from this disease. The vaccine is usually given as 2 doses, 6 to 18 months apart.   Hepatitis B. You need this vaccine if you have a specific risk factor for hepatitis B virus infection or you simply wish to be protected from this disease. The vaccine is given in 3 doses, usually over 6 months.  Preventative Service / Frequency Ages 19 to 39  Blood pressure check.** / Every 1 to 2 years.   Lipid and cholesterol check.** / Every 5 years beginning at age 20.   Hepatitis C blood test.** / For any individual with known risks for hepatitis C.   Skin self-exam. / Monthly.   Influenza immunization.** / Every year.   Pneumococcal polysaccharide immunization.** / 1 to 2 doses if you smoke cigarettes or if you have certain chronic medical conditions.   Tetanus, diphtheria, pertussis (Tdap,Td) immunization. / A one-time dose of Tdap vaccine. After that, you need a Td booster dose every 10 years.   HPV immunization. / 3 doses over 6 months, if 26 and younger.   Measles, mumps, rubella (MMR) immunization. / You need at least 1 dose of MMR if you were born in 1957 or later. You may also need a 2nd dose.   Meningococcal immunization. / 1 dose if you are age 19 to 21 years and a first-year college student living in a residence hall, or have one of several medical conditions, you need to get vaccinated against meningococcal disease. You may also need additional booster doses.   Varicella immunization.** / Consult your caregiver.   Hepatitis A immunization.** / Consult your caregiver. 2 doses, 6 to 18 months apart.   Hepatitis B  immunization.** / Consult your caregiver. 3 doses usually over 6 months.  Ages 40 to 64  Blood pressure check.** / Every 1 to 2 years.   Lipid and cholesterol check.** / Every 5 years beginning at age 20.   Fecal occult blood test (FOBT) of stool. / Every year beginning at age 50 and continuing until age 75. You may not have to do this test if   you get colonoscopy every 10 years.   Flexible sigmoidoscopy** or colonoscopy.** / Every 5 years for a flexible sigmoidoscopy or every 10 years for a colonoscopy beginning at age 50 and continuing until age 75.   Hepatitis C blood test.** / For all people born from 1945 through 1965 and any individual with known risks for hepatitis C.   Skin self-exam. / Monthly.   Influenza immunization.** / Every year.   Pneumococcal polysaccharide immunization.** / 1 to 2 doses if you smoke cigarettes or if you have certain chronic medical conditions.   Tetanus, diphtheria, pertussis (Tdap/Td) immunization.** / A one-time dose of Tdap vaccine. After that, you need a Td booster dose every 10 years.   Measles, mumps, rubella (MMR) immunization. / You need at least 1 dose of MMR if you were born in 1957 or later. You may also need a 2nd dose.   Varicella immunization.**/ Consult your caregiver.   Meningococcal immunization.** / Consult your caregiver.   Hepatitis A immunization.** / Consult your caregiver. 2 doses, 6 to 18 months apart.   Hepatitis B immunization.** / Consult your caregiver. 3 doses, usually over 6 months.  Ages 65 and over  Blood pressure check.** / Every 1 to 2 years.   Lipid and cholesterol check.**/ Every 5 years beginning at age 20.   Fecal occult blood test (FOBT) of stool. / Every year beginning at age 50 and continuing until age 75. You may not have to do this test if you get colonoscopy every 10 years.   Flexible sigmoidoscopy** or colonoscopy.** / Every 5 years for a flexible sigmoidoscopy or every 10 years for a colonoscopy  beginning at age 50 and continuing until age 75.   Hepatitis C blood test.** / For all people born from 1945 through 1965 and any individual with known risks for hepatitis C.   Abdominal aortic aneurysm (AAA) screening.** / A one-time screening for ages 65 to 75 years who are current or former smokers.   Skin self-exam. / Monthly.   Influenza immunization.** / Every year.   Pneumococcal polysaccharide immunization.** / 1 dose at age 65 (or older) if you have never been vaccinated.   Tetanus, diphtheria, pertussis (Tdap, Td) immunization. / A one-time dose of Tdap vaccine if you are over 65 and have contact with an infant, are a healthcare worker, or simply want to be protected from whooping cough. After that, you need a Td booster dose every 10 years.   Varicella immunization. ** / Consult your caregiver.   Meningococcal immunization.** / Consult your caregiver.   Hepatitis A immunization. ** / Consult your caregiver. 2 doses, 6 to 18 months apart.   Hepatitis B immunization.** / Check with your caregiver. 3 doses, usually over 6 months.  **Family history and personal history of risk and conditions may change your caregiver's recommendations. Document Released: 11/30/2001 Document Revised: 09/23/2011 Document Reviewed: 03/01/2011 [employer] Patient Information 2012 [employer]. 

## 2012-03-02 NOTE — Assessment & Plan Note (Signed)
Stable con't meds 

## 2012-03-02 NOTE — Assessment & Plan Note (Signed)
Check labs 

## 2012-03-04 LAB — URINE CULTURE
Colony Count: NO GROWTH
Organism ID, Bacteria: NO GROWTH

## 2012-06-06 ENCOUNTER — Ambulatory Visit (INDEPENDENT_AMBULATORY_CARE_PROVIDER_SITE_OTHER): Payer: BC Managed Care – PPO | Admitting: Family Medicine

## 2012-06-06 ENCOUNTER — Encounter: Payer: Self-pay | Admitting: Family Medicine

## 2012-06-06 ENCOUNTER — Ambulatory Visit (HOSPITAL_BASED_OUTPATIENT_CLINIC_OR_DEPARTMENT_OTHER)
Admission: RE | Admit: 2012-06-06 | Discharge: 2012-06-06 | Disposition: A | Discharge status: Home / Self Care | Payer: BC Managed Care – PPO | Source: Ambulatory Visit | Attending: Family Medicine | Admitting: Family Medicine

## 2012-06-06 VITALS — BP 118/68 | HR 68 | Temp 98.5°F | Wt 267.0 lb

## 2012-06-06 DIAGNOSIS — M109 Gout, unspecified: Secondary | ICD-10-CM | POA: Insufficient documentation

## 2012-06-06 DIAGNOSIS — M79674 Pain in right toe(s): Secondary | ICD-10-CM

## 2012-06-06 DIAGNOSIS — L0291 Cutaneous abscess, unspecified: Secondary | ICD-10-CM

## 2012-06-06 DIAGNOSIS — L0390 Cellulitis, unspecified: Secondary | ICD-10-CM

## 2012-06-06 DIAGNOSIS — M79609 Pain in unspecified limb: Secondary | ICD-10-CM | POA: Insufficient documentation

## 2012-06-06 MED ORDER — NAPROXEN 500 MG PO TABS: 500.0000 mg | ORAL_TABLET | Freq: Two times a day (BID) | ORAL | Status: DC

## 2012-06-06 MED ORDER — CEPHALEXIN 500 MG PO CAPS: 500.0000 mg | ORAL_CAPSULE | Freq: Two times a day (BID) | ORAL | Status: AC

## 2012-06-06 NOTE — Progress Notes (Signed)
  Subjective:    Patient ID: Joseph [name], male    DOB: 01/27/1956, 56 y.o.   MRN: [id number]  HPI Pt here c/o pain in R big toe.   He was partying a lot this weekend --drinking and dancing barefoot.  He said it is possible he injured his foot. He is worried about infection.  No other complains.     Review of Systems As above    Objective:   Physical Exam  Constitutional: He appears well-developed and well-nourished.  Skin: There is erythema.       R toe-- warm to touch and tender , + errythema and swelling  Psychiatric: He has a normal mood and affect. His behavior is normal. Judgment and thought content normal.          Assessment & Plan:  Toe pain----   prob Gout ----? Cellulitis but doub                   Gave pt handout on gout Naprosyn 500 mg bid  Check labs Check xray

## 2012-06-06 NOTE — Patient Instructions (Signed)
Gout Gout is an inflammatory condition (arthritis) caused by a buildup of uric acid crystals in the joints. Uric acid is a chemical that is normally present in the blood. Under some circumstances, uric acid can form into crystals in your joints. This causes joint redness, soreness, and swelling (inflammation). Repeat attacks are common. Over time, uric acid crystals can form into masses (tophi) near a joint, causing disfigurement. Gout is treatable and often preventable. CAUSES  The disease begins with elevated levels of uric acid in the blood. Uric acid is produced by your body when it breaks down a naturally found substance called purines. This also happens when you eat certain foods such as meats and fish. Causes of an elevated uric acid level include:  Being passed down from parent to child (heredity).   Diseases that cause increased uric acid production (obesity, psoriasis, some cancers).   Excessive alcohol use.   Diet, especially diets rich in meat and seafood.   Medicines, including certain cancer-fighting drugs (chemotherapy), diuretics, and aspirin.   Chronic kidney disease. The kidneys are no longer able to remove uric acid well.   Problems with metabolism.  Conditions strongly associated with gout include:  Obesity.   High blood pressure.   High cholesterol.   Diabetes.  Not everyone with elevated uric acid levels gets gout. It is not understood why some people get gout and others do not. Surgery, joint injury, and eating too much of certain foods are some of the factors that can lead to gout. SYMPTOMS   An attack of gout comes on quickly. It causes intense pain with redness, swelling, and warmth in a joint.   Fever can occur.   Often, only one joint is involved. Certain joints are more commonly involved:   Base of the big toe.   Knee.   Ankle.   Wrist.   Finger.  Without treatment, an attack usually goes away in a few days to weeks. Between attacks, you  usually will not have symptoms, which is different from many other forms of arthritis. DIAGNOSIS  Your caregiver will suspect gout based on your symptoms and exam. Removal of fluid from the joint (arthrocentesis) is done to check for uric acid crystals. Your caregiver will give you a medicine that numbs the area (local anesthetic) and use a needle to remove joint fluid for exam. Gout is confirmed when uric acid crystals are seen in joint fluid, using a special microscope. Sometimes, blood, urine, and X-ray tests are also used. TREATMENT  There are 2 phases to gout treatment: treating the sudden onset (acute) attack and preventing attacks (prophylaxis). Treatment of an Acute Attack  Medicines are used. These include anti-inflammatory medicines or steroid medicines.   An injection of steroid medicine into the affected joint is sometimes necessary.   The painful joint is rested. Movement can worsen the arthritis.   You may use warm or cold treatments on painful joints, depending which works best for you.   Discuss the use of coffee, vitamin C, or cherries with your caregiver. These may be helpful treatment options.  Treatment to Prevent Attacks After the acute attack subsides, your caregiver may advise prophylactic medicine. These medicines either help your kidneys eliminate uric acid from your body or decrease your uric acid production. You may need to stay on these medicines for a very long time. The early phase of treatment with prophylactic medicine can be associated with an increase in acute gout attacks. For this reason, during the first few months   of treatment, your caregiver may also advise you to take medicines usually used for acute gout treatment. Be sure you understand your caregiver's directions. You should also discuss dietary treatment with your caregiver. Certain foods such as meats and fish can increase uric acid levels. Other foods such as dairy can decrease levels. Your caregiver  can give you a list of foods to avoid. HOME CARE INSTRUCTIONS   Do not take aspirin to relieve pain. This raises uric acid levels.   Only take over-the-counter or prescription medicines for pain, discomfort, or fever as directed by your caregiver.   Rest the joint as much as possible. When in bed, keep sheets and blankets off painful areas.   Keep the affected joint raised (elevated).   Use crutches if the painful joint is in your leg.   Drink enough water and fluids to keep your urine clear or pale yellow. This helps your body get rid of uric acid. Do not drink alcoholic beverages. They slow the passage of uric acid.   Follow your caregiver's dietary instructions. Pay careful attention to the amount of protein you eat. Your daily diet should emphasize fruits, vegetables, whole grains, and fat-free or low-fat milk products.   Maintain a healthy body weight.  SEEK MEDICAL CARE IF:   You have an oral temperature above 102 F (38.9 C).   You develop diarrhea, vomiting, or any side effects from medicines.   You do not feel better in 24 hours, or you are getting worse.  SEEK IMMEDIATE MEDICAL CARE IF:   Your joint becomes suddenly more tender and you have:   Chills.   An oral temperature above 102 F (38.9 C), not controlled by medicine.  MAKE SURE YOU:   Understand these instructions.   Will watch your condition.   Will get help right away if you are not doing well or get worse.  Document Released: 10/01/2000 Document Revised: 09/23/2011 Document Reviewed: 01/12/2010 [employer] Patient Information 2012 [employer]. 

## 2012-06-07 LAB — CBC WITH DIFFERENTIAL/PLATELET
Basophils Absolute: 0 10*3/uL (ref 0.0–0.1)
Basophils Relative: 0.3 % (ref 0.0–3.0)
Eosinophils Absolute: 0.2 10*3/uL (ref 0.0–0.7)
Eosinophils Relative: 2.7 % (ref 0.0–5.0)
HCT: 44.8 % (ref 39.0–52.0)
Hemoglobin: 15 g/dL (ref 13.0–17.0)
Lymphocytes Relative: 26.7 % (ref 12.0–46.0)
Lymphs Abs: 2.2 10*3/uL (ref 0.7–4.0)
MCHC: 33.6 g/dL (ref 30.0–36.0)
MCV: 95.2 fl (ref 78.0–100.0)
Monocytes Absolute: 0.7 10*3/uL (ref 0.1–1.0)
Monocytes Relative: 8.8 % (ref 3.0–12.0)
Neutro Abs: 5 10*3/uL (ref 1.4–7.7)
Neutrophils Relative %: 61.5 % (ref 43.0–77.0)
Platelets: 210 10*3/uL (ref 150.0–400.0)
RBC: 4.7 Mil/uL (ref 4.22–5.81)
RDW: 12.4 % (ref 11.5–14.6)
WBC: 8.1 10*3/uL (ref 4.5–10.5)

## 2012-06-07 LAB — BASIC METABOLIC PANEL
BUN: 16 mg/dL (ref 6–23)
Chloride: 102 mEq/L (ref 96–112)
Creatinine, Ser: 1.1 mg/dL (ref 0.4–1.5)
Glucose, Bld: 81 mg/dL (ref 70–99)
Potassium: 4 mEq/L (ref 3.5–5.1)

## 2012-06-14 ENCOUNTER — Telehealth: Payer: Self-pay | Admitting: *Deleted

## 2012-06-14 NOTE — Telephone Encounter (Signed)
noted 

## 2012-06-14 NOTE — Telephone Encounter (Signed)
Pt states that pain is better.    Notes Recorded by Arnette Norris, CMA on 06/14/2012 at 1:16 PM msg left on machine @ [phone] [facility]) ------  Notes Recorded by Lelon Perla, DO on 06/08/2012 at 7:55 AM Uric acid slightly high----- how is pain?

## 2012-07-21 ENCOUNTER — Encounter: Payer: Self-pay | Admitting: Internal Medicine

## 2012-11-02 ENCOUNTER — Telehealth: Payer: Self-pay | Admitting: *Deleted

## 2012-11-02 MED ORDER — TADALAFIL 5 MG PO TABS: 5.0000 mg | ORAL_TABLET | Freq: Every day | ORAL | Status: DC

## 2012-11-02 NOTE — Telephone Encounter (Signed)
cialis 5 mg #30  1 po qd   5 refills

## 2012-11-02 NOTE — Telephone Encounter (Signed)
Pt states that he was given samples of 5 mg cialis and now would like to get a Rx for this.Please advise

## 2013-03-02 ENCOUNTER — Other Ambulatory Visit: Payer: Self-pay | Admitting: Family Medicine

## 2013-03-02 ENCOUNTER — Telehealth: Payer: Self-pay | Admitting: General Practice

## 2013-03-02 MED ORDER — TADALAFIL 5 MG PO TABS: 5.0000 mg | ORAL_TABLET | Freq: Every day | ORAL | Status: DC

## 2013-03-02 NOTE — Telephone Encounter (Signed)
Pt called today wanting a refill on his Lisinopril (filled 03-02-13) and Viagra. Per our records pt is on Cialis. Pt last seen on 06-06-12 and Cialis was last filled on 11-02-12 # 30 with 5 refills. Refilled Cialis.

## 2013-05-01 ENCOUNTER — Ambulatory Visit (INDEPENDENT_AMBULATORY_CARE_PROVIDER_SITE_OTHER): Payer: BC Managed Care – PPO | Admitting: Family Medicine

## 2013-05-01 ENCOUNTER — Encounter: Payer: Self-pay | Admitting: Family Medicine

## 2013-05-01 VITALS — BP 117/87 | HR 73 | Temp 98.6°F | Wt 268.4 lb

## 2013-05-01 DIAGNOSIS — E785 Hyperlipidemia, unspecified: Secondary | ICD-10-CM

## 2013-05-01 DIAGNOSIS — I10 Essential (primary) hypertension: Secondary | ICD-10-CM

## 2013-05-01 DIAGNOSIS — R319 Hematuria, unspecified: Secondary | ICD-10-CM

## 2013-05-01 DIAGNOSIS — N529 Male erectile dysfunction, unspecified: Secondary | ICD-10-CM

## 2013-05-01 LAB — MICROALBUMIN / CREATININE URINE RATIO: Microalb Creat Ratio: 0.2 mg/g (ref 0.0–30.0)

## 2013-05-01 LAB — POCT URINALYSIS DIPSTICK
Bilirubin, UA: NEGATIVE
Ketones, UA: NEGATIVE
Leukocytes, UA: NEGATIVE
Protein, UA: NEGATIVE
Spec Grav, UA: 1.01
pH, UA: 6.5

## 2013-05-01 LAB — BASIC METABOLIC PANEL
Calcium: 9 mg/dL (ref 8.4–10.5)
Creatinine, Ser: 0.9 mg/dL (ref 0.4–1.5)
GFR: 99.56 mL/min (ref >60.00)
Glucose, Bld: 92 mg/dL (ref 70–99)
Sodium: 137 mEq/L (ref 135–145)

## 2013-05-01 LAB — HEPATIC FUNCTION PANEL
Albumin: 4.2 g/dL (ref 3.5–5.2)
Alkaline Phosphatase: 64 U/L (ref 39–117)

## 2013-05-01 LAB — LIPID PANEL
HDL: 30.5 mg/dL — ABNORMAL LOW (ref >39.00)
Triglycerides: 99 mg/dL (ref 0.0–149.0)
VLDL: 19.8 mg/dL (ref 0.0–40.0)

## 2013-05-01 MED ORDER — TADALAFIL 5 MG PO TABS: 5.0000 mg | ORAL_TABLET | Freq: Every day | ORAL | Status: DC

## 2013-05-01 MED ORDER — SILDENAFIL CITRATE 100 MG PO TABS: 50.0000 mg | ORAL_TABLET | Freq: Every day | ORAL | Status: DC | PRN

## 2013-05-01 MED ORDER — LISINOPRIL 20 MG PO TABS: ORAL_TABLET | ORAL | Status: DC

## 2013-05-01 NOTE — Assessment & Plan Note (Signed)
Refill cialis--- pt would like to try viagra---rx 100 mg viagra written Pt instructed not to use at same time

## 2013-05-01 NOTE — Patient Instructions (Addendum)

## 2013-05-01 NOTE — Addendum Note (Signed)
Addended by: Silvio Pate D on: 05/01/2013 04:43 PM   Modules accepted: Orders

## 2013-05-01 NOTE — Progress Notes (Signed)
  Subjective:    Patient here for follow-up of elevated blood pressure.  He is not exercising and is adherent to a low-salt diet.  Blood pressure is well controlled at home. Cardiac symptoms: none. Patient denies: chest pain, chest pressure/discomfort, claudication, dyspnea, exertional chest pressure/discomfort, fatigue, irregular heart beat, lower extremity edema, near-syncope, orthopnea, palpitations, paroxysmal nocturnal dyspnea, syncope and tachypnea. Cardiovascular risk factors: hypertension, male gender and sedentary lifestyle. Use of agents associated with hypertension: none. History of target organ damage: none.  The following portions of the patient's history were reviewed and updated as appropriate: allergies, current medications, past family history, past medical history, past social history, past surgical history and problem list.  Review of Systems Pertinent items are noted in HPI.     Objective:    BP 117/87  Pulse 73  Temp(Src) 98.6 F (37 C) (Oral)  Wt 268 lb 6.4 oz (121.745 kg)  BMI 34.45 kg/m2  SpO2 97% General appearance: alert, cooperative, appears stated age and no distress Neck: no adenopathy, no carotid bruit, no JVD, supple, symmetrical, trachea midline and thyroid not enlarged, symmetric, no tenderness/mass/nodules Lungs: clear to auscultation bilaterally Heart: S1, S2 normal Extremities: extremities normal, atraumatic, no cyanosis or edema    Assessment:    Hypertension, normal blood pressure . Evidence of target organ damage: none.    Plan:    Medication: no change. Dietary sodium restriction. Regular aerobic exercise. Follow up: 6 months and as needed.

## 2013-05-01 NOTE — Assessment & Plan Note (Signed)
Check labs 

## 2013-05-01 NOTE — Assessment & Plan Note (Signed)
Stable con't meds 

## 2013-08-23 ENCOUNTER — Other Ambulatory Visit: Payer: Self-pay

## 2014-02-14 ENCOUNTER — Telehealth: Payer: Self-pay | Admitting: Family Medicine

## 2014-02-14 MED ORDER — SILDENAFIL CITRATE 100 MG PO TABS: 50.0000 mg | ORAL_TABLET | Freq: Every day | ORAL | Status: DC | PRN

## 2014-02-14 NOTE — Telephone Encounter (Signed)
Patient aware Rx sent       KP 

## 2014-02-14 NOTE — Telephone Encounter (Signed)
Caller name:Josemiguel Hogg Relation to ZO:XWRUEAVpt:patient Call back number:[phone] [facility]  Reason for call: to request a refill for Viagra.

## 2014-02-25 ENCOUNTER — Telehealth: Payer: Self-pay | Admitting: Family Medicine

## 2014-02-25 MED ORDER — SILDENAFIL CITRATE 100 MG PO TABS: 50.0000 mg | ORAL_TABLET | Freq: Every day | ORAL | Status: DC | PRN

## 2014-02-25 NOTE — Telephone Encounter (Signed)
It can be #10

## 2014-02-25 NOTE — Telephone Encounter (Addendum)
Caller name:Alen Cobaugh Relation to pt:patient CZO:XWRUEAVall back number: [phone] Pharmacy:  Reason for call:  To see why only 3 tablets was called in for Viagra for him. Patient was not sure if this was a mistake or not. Please advise.

## 2014-02-25 NOTE — Telephone Encounter (Signed)
Rx sent and wife has been made aware.    KP

## 2014-02-25 NOTE — Telephone Encounter (Signed)
Patient wants an increased quantity of Viagra. He is only getting 3 .Please advise.    KP

## 2014-03-19 ENCOUNTER — Ambulatory Visit (INDEPENDENT_AMBULATORY_CARE_PROVIDER_SITE_OTHER): Payer: BC Managed Care – PPO | Admitting: Family Medicine

## 2014-03-19 ENCOUNTER — Encounter: Payer: Self-pay | Admitting: Family Medicine

## 2014-03-19 VITALS — BP 144/78 | HR 80 | Temp 98.4°F | Wt 269.8 lb

## 2014-03-19 DIAGNOSIS — E785 Hyperlipidemia, unspecified: Secondary | ICD-10-CM

## 2014-03-19 DIAGNOSIS — Z136 Encounter for screening for cardiovascular disorders: Secondary | ICD-10-CM

## 2014-03-19 DIAGNOSIS — I10 Essential (primary) hypertension: Secondary | ICD-10-CM

## 2014-03-19 DIAGNOSIS — E669 Obesity, unspecified: Secondary | ICD-10-CM | POA: Insufficient documentation

## 2014-03-19 NOTE — Progress Notes (Signed)
Pre visit review using our clinic review tool, if applicable. No additional management support is needed unless otherwise documented below in the visit note. 

## 2014-03-19 NOTE — Progress Notes (Signed)
   Subjective:    Patient ID: Joseph [name], male    DOB: 1956/01/03, 58 y.o.   MRN: [id number]  HPI Pt here to request a stress test.  A friend of theirs just died from MI at 30 and family hx.  Pt denies cp, sob.     Review of Systems    as above Objective:   Physical Exam  BP 144/78  Pulse 80  Temp(Src) 98.4 F (36.9 C) (Oral)  Wt 269 lb 12.8 oz (122.38 kg)  SpO2 97% General appearance: alert, cooperative, appears stated age and no distress Throat: lips, mucosa, and tongue normal; teeth and gums normal Neck: no adenopathy, no carotid bruit, no JVD, supple, symmetrical, trachea midline and thyroid not enlarged, symmetric, no tenderness/mass/nodules Lungs: clear to auscultation bilaterally Heart: regular rate and rhythm, S1, S2 normal, no murmur, click, rub or gallop Extremities: extremities normal, atraumatic, no cyanosis or edema       Assessment & Plan:  1. Screening, ischemic heart disease Check labs - EKG 12-Lead - Exercise tolerance test; Future  2. HTN (hypertension) con't meds - Basic metabolic panel; Future - Hepatic function panel; Future - Lipid panel; Future  3. Other and unspecified hyperlipidemia Check labs - Hepatic function panel; Future - Lipid panel; Future

## 2014-03-19 NOTE — Patient Instructions (Signed)
Cardiopulmonary Stress Test  Cardiopulmonary stress testing looks at how your body uses oxygen and how it responds to exercise. The test will evaluate your:  Heart.  Lungs.  Muscles. INDICATIONS A cardiopulmonary stress test is usually indicated for the following:  Evaluation of unexplained shortness of breath.  Evaluation of exercise tolerance for a person with heart failure or coronary artery disease (CAD).  Evaluation of lung function. This is useful in patients with chronic obstructive pulmonary disease (COPD) or pulmonary hypertension.  To evaluate how fit you are.  To assess your eligibility for disability.  Preoperative evaluation for:  Heart surgery.  Lung surgery.  Heart/Lung transplants. BEFORE THE PROCEDURE  Do not eat for at least two hours prior to the test or as told by your caregiver.  Do not drink alcohol 24 hours prior to the test.  Do not smoke, drink caffeinated or carbonated beverages for at least 6 hours prior to the test.  Wear loose, comfortable clothing and shoes.  If you use an inhaler, you should bring it with you to the test. PROCEDURE  Electrodes will be attached to your chest. These will be attached to a monitor that will monitor your heart rate.  A nose clip will be applied to your nose.  A headpiece will be placed on your head. This will hold a breathing tube in place that you will breath through during the test.  You will exercise using either a bicycle or a treadmill.  Usually the study will begin with a few minutes of simple exercise followed by increased exercise.  Your blood pressure, heart rate and breathing will be monitored during and after the test. You will go home when your caregiver feels it is safe for you to do so. SEEK IMMEDIATE MEDICAL CARE IF:  You develop pain or pressure in the following areas:  Chest.  Jaw or neck.  Between your shoulder blades.  Pain radiating down your left arm.  You develop nausea  (feeling sick to your stomach).  You develop vomiting.  You develop fainting or dizziness.  You develop shortness of breath. MAKE SURE YOU:   Understand these instructions.  Will watch your condition.  Will get help right away if you are not doing well or get worse. Document Released: 09/22/2009 Document Revised: 12/27/2011 Document Reviewed: 09/22/2009 Exit[employer] Patient Information 2014 [employer].  

## 2014-03-20 ENCOUNTER — Telehealth: Payer: Self-pay | Admitting: Family Medicine

## 2014-03-20 NOTE — Telephone Encounter (Signed)
Relevant patient education assigned to patient using Emmi. ° °

## 2014-03-25 ENCOUNTER — Other Ambulatory Visit (INDEPENDENT_AMBULATORY_CARE_PROVIDER_SITE_OTHER): Payer: BC Managed Care – PPO

## 2014-03-25 DIAGNOSIS — I10 Essential (primary) hypertension: Secondary | ICD-10-CM

## 2014-03-25 DIAGNOSIS — E785 Hyperlipidemia, unspecified: Secondary | ICD-10-CM

## 2014-03-25 LAB — LIPID PANEL
CHOLESTEROL: 149 mg/dL (ref 0–200)
HDL: 30.7 mg/dL — ABNORMAL LOW (ref >39.00)
LDL CALC: 99 mg/dL (ref 0–99)
NonHDL: 118.3
Total CHOL/HDL Ratio: 5
Triglycerides: 98 mg/dL (ref 0.0–149.0)
VLDL: 19.6 mg/dL (ref 0.0–40.0)

## 2014-03-25 LAB — BASIC METABOLIC PANEL
BUN: 16 mg/dL (ref 6–23)
CO2: 27 mEq/L (ref 19–32)
Calcium: 8.6 mg/dL (ref 8.4–10.5)
Chloride: 104 mEq/L (ref 96–112)
Creatinine, Ser: 0.8 mg/dL (ref 0.4–1.5)
GFR: 112.92 mL/min (ref >60.00)
Glucose, Bld: 96 mg/dL (ref 70–99)
POTASSIUM: 3.6 meq/L (ref 3.5–5.1)
SODIUM: 137 meq/L (ref 135–145)

## 2014-03-25 LAB — HEPATIC FUNCTION PANEL
ALT: 35 U/L (ref 0–53)
AST: 41 U/L — AB (ref 0–37)
Albumin: 4 g/dL (ref 3.5–5.2)
Alkaline Phosphatase: 62 U/L (ref 39–117)
BILIRUBIN TOTAL: 0.5 mg/dL (ref 0.2–1.2)
Bilirubin, Direct: 0 mg/dL (ref 0.0–0.3)
Total Protein: 6.8 g/dL (ref 6.0–8.3)

## 2014-05-03 ENCOUNTER — Encounter: Payer: Self-pay | Admitting: Family Medicine

## 2014-05-03 ENCOUNTER — Ambulatory Visit (INDEPENDENT_AMBULATORY_CARE_PROVIDER_SITE_OTHER): Payer: BC Managed Care – PPO | Admitting: Physician Assistant

## 2014-05-03 ENCOUNTER — Ambulatory Visit (INDEPENDENT_AMBULATORY_CARE_PROVIDER_SITE_OTHER): Payer: BC Managed Care – PPO | Admitting: Family Medicine

## 2014-05-03 VITALS — BP 128/78 | HR 114 | Temp 98.0°F | Resp 16 | Wt 266.0 lb

## 2014-05-03 DIAGNOSIS — Z136 Encounter for screening for cardiovascular disorders: Secondary | ICD-10-CM

## 2014-05-03 DIAGNOSIS — IMO0002 Reserved for concepts with insufficient information to code with codable children: Secondary | ICD-10-CM

## 2014-05-03 DIAGNOSIS — M5410 Radiculopathy, site unspecified: Secondary | ICD-10-CM | POA: Insufficient documentation

## 2014-05-03 MED ORDER — MELOXICAM 15 MG PO TABS: 15.0000 mg | ORAL_TABLET | Freq: Every day | ORAL | Status: DC

## 2014-05-03 MED ORDER — CYCLOBENZAPRINE HCL 10 MG PO TABS: 10.0000 mg | ORAL_TABLET | Freq: Three times a day (TID) | ORAL | Status: DC | PRN

## 2014-05-03 NOTE — Patient Instructions (Signed)
Follow up as needed Start the Mobic once daily for pain/inflammation (take w/ food) Use the flexeril as needed for muscle spasm.  Best at night b/c it does cause fatigue HEAT! Gentle stretching and core strengthening will help Call if symptoms change or worsen- we can always refer to ortho Hang in there!!

## 2014-05-03 NOTE — Progress Notes (Signed)
Pre visit review using our clinic review tool, if applicable. No additional management support is needed unless otherwise documented below in the visit note. 

## 2014-05-03 NOTE — Progress Notes (Signed)
   Subjective:    Patient ID: Joseph [name], male    DOB: Sep 13, 1956, 58 y.o.   MRN: [id number]  HPI R leg numbness- sxs started '10-15 yrs ago'.  Used to only occur when standing on hard floors for prolonged periods of time.  L leg would also go numb although less frequently.  This week, sxs were occuring w/ less time standing and more frequently.  Last night, developed R leg numbness from buttock to toes while walking w/ wife.  Had to sit down, sxs resolved.  Was able to complete stress test this AM w/o difficulty.  No recent heavy lifting, no change in activity level.  No weakness.  Pt reports had back pain Tues-Thurs but 'it's all around much better today'.   Review of Systems For ROS see HPI     Objective:   Physical Exam  Vitals reviewed. Constitutional: He is oriented to person, place, and time. He appears well-developed and well-nourished. No distress.  Cardiovascular: Intact distal pulses.   Musculoskeletal:  Good flexion/extension of spine Mild TTP over R SI joint  Neurological: He is alert and oriented to person, place, and time. He has normal reflexes. No cranial nerve deficit. Coordination normal.  (-) SLR bilaterally          Assessment & Plan:

## 2014-05-03 NOTE — Assessment & Plan Note (Signed)
New.  Pt's sxs have resolved today.  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  Will start NSAIDs and flexeril prn.  Offered ortho referral, pt declined.  Reviewed supportive care and red flags that should prompt return.  Pt expressed understanding and is in agreement w/ plan.

## 2014-05-03 NOTE — Progress Notes (Signed)
Exercise Treadmill Test  Joseph [name] is a 58 y.o. male with a hx of HTN referred by PCP for ETT.  No FHx of CAD.  No Diabetes.  Exam unremarkable.  ECG:  NSR, inc RBBB, no ST changes.  Pre-Exercise Testing Evaluation Rhythm: normal sinus  Rate: 71 bpm     Test  Exercise Tolerance Test Ordering MD: Sherryl MangesSteven Klein, MD  Interpreting MD: Tereso NewcomerScott Gricel Copen, PA-C  Unique Test No: 1  Treadmill:  1  Indication for ETT: Abnormal EKG  Contraindication to ETT: No   Stress Modality: exercise - treadmill  Cardiac Imaging Performed: non   Protocol: standard Bruce - maximal  Max BP:  171/78  Max MPHR (bpm):  165 85% MPR (bpm):  140  MPHR obtained (bpm):  151 % MPHR obtained:  91  Reached 85% MPHR (min:sec):  9:10 Total Exercise Time (min-sec):  10:00  Workload in METS:  10.7 Borg Scale: 17  Reason ETT Terminated:  desired heart rate attained    ST Segment Analysis At Rest: normal ST segments - no evidence of significant ST depression With Exercise: no evidence of significant ST depression  Other Information Arrhythmia:  No Angina during ETT:  absent (0) Quality of ETT:  diagnostic  ETT Interpretation:  normal - no evidence of ischemia by ST analysis  Comments: Good exercise capacity. No chest pain. Normal BP response to exercise. No ST changes to suggest ischemia.   Recommendations: F/u with PCP as directed. Signed, Tereso NewcomerScott Kashmere Staffa, PA-C   05/03/2014 10:09 AM

## 2014-07-05 ENCOUNTER — Other Ambulatory Visit: Payer: Self-pay | Admitting: Family Medicine

## 2014-08-01 ENCOUNTER — Other Ambulatory Visit: Payer: Self-pay | Admitting: General Practice

## 2014-08-01 MED ORDER — CYCLOBENZAPRINE HCL 10 MG PO TABS: 10.0000 mg | ORAL_TABLET | Freq: Three times a day (TID) | ORAL | Status: DC | PRN

## 2014-08-01 MED ORDER — MELOXICAM 15 MG PO TABS: 15.0000 mg | ORAL_TABLET | Freq: Every day | ORAL | Status: DC

## 2014-08-02 ENCOUNTER — Other Ambulatory Visit: Payer: Self-pay

## 2014-10-01 ENCOUNTER — Other Ambulatory Visit: Payer: Self-pay | Admitting: General Practice

## 2014-10-01 MED ORDER — MELOXICAM 15 MG PO TABS: 15.0000 mg | ORAL_TABLET | Freq: Every day | ORAL | Status: DC

## 2014-11-07 ENCOUNTER — Other Ambulatory Visit: Payer: Self-pay | Admitting: Family Medicine

## 2014-11-07 MED ORDER — LISINOPRIL 20 MG PO TABS: 20.0000 mg | ORAL_TABLET | Freq: Every day | ORAL | Status: DC

## 2014-11-07 MED ORDER — TADALAFIL 5 MG PO TABS: 5.0000 mg | ORAL_TABLET | Freq: Every day | ORAL | Status: DC

## 2014-11-07 NOTE — Telephone Encounter (Signed)
Caller name: Jia Relation to pt: self Call back number: [phone] [facility] P: [phone]  And F: [phone]  Reason for call:   Requesting refills of cialis and lisinopril

## 2014-11-07 NOTE — Telephone Encounter (Signed)
Rx faxed.    KP 

## 2014-11-11 ENCOUNTER — Telehealth: Payer: Self-pay | Admitting: General Practice

## 2014-11-11 ENCOUNTER — Other Ambulatory Visit: Payer: Self-pay | Admitting: Family Medicine

## 2014-11-11 DIAGNOSIS — M545 Low back pain, unspecified: Secondary | ICD-10-CM

## 2014-11-11 MED ORDER — MELOXICAM 15 MG PO TABS: 15.0000 mg | ORAL_TABLET | Freq: Every day | ORAL | Status: DC

## 2014-11-11 NOTE — Telephone Encounter (Signed)
No additional refills from me- appt was 6 months ago.  Will defer to PCP for ongoing tx of back pain

## 2014-11-11 NOTE — Telephone Encounter (Signed)
filled

## 2014-11-11 NOTE — Telephone Encounter (Signed)
Last OV 05-03-14 (back pain-Dr. lowne Pt) Meloxicam last filled 10-01-14 #30 with 0

## 2014-11-11 NOTE — Telephone Encounter (Signed)
Med denied,

## 2015-01-02 ENCOUNTER — Other Ambulatory Visit: Payer: Self-pay | Admitting: Family Medicine

## 2015-01-14 ENCOUNTER — Telehealth: Payer: Self-pay | Admitting: Family Medicine

## 2015-01-14 DIAGNOSIS — R200 Anesthesia of skin: Secondary | ICD-10-CM

## 2015-01-14 NOTE — Telephone Encounter (Signed)
Refer to neurology for leg numbness

## 2015-01-14 NOTE — Telephone Encounter (Signed)
Please advise      KP 

## 2015-01-14 NOTE — Telephone Encounter (Signed)
Ref placed.      KP 

## 2015-01-14 NOTE — Telephone Encounter (Signed)
Caller name: Marcelo BaldyCraven, Caedmon Relation to pt: self  Call back number: [phone]   Reason for call:  Pt requesting a referral to a specialist regarding leg falling asleep when stationary. Advised pt to schedule an appointment pt stated MD is aware of issues regarding leg Please advised

## 2015-02-10 ENCOUNTER — Ambulatory Visit (INDEPENDENT_AMBULATORY_CARE_PROVIDER_SITE_OTHER): Payer: BLUE CROSS/BLUE SHIELD | Admitting: Neurology

## 2015-02-10 ENCOUNTER — Encounter: Payer: Self-pay | Admitting: Neurology

## 2015-02-10 VITALS — BP 118/78 | HR 77 | Ht 74.0 in | Wt 269.6 lb

## 2015-02-10 DIAGNOSIS — M4806 Spinal stenosis, lumbar region: Secondary | ICD-10-CM | POA: Diagnosis not present

## 2015-02-10 DIAGNOSIS — Z7289 Other problems related to lifestyle: Secondary | ICD-10-CM

## 2015-02-10 DIAGNOSIS — Z789 Other specified health status: Secondary | ICD-10-CM

## 2015-02-10 DIAGNOSIS — R29898 Other symptoms and signs involving the musculoskeletal system: Secondary | ICD-10-CM | POA: Diagnosis not present

## 2015-02-10 DIAGNOSIS — R208 Other disturbances of skin sensation: Secondary | ICD-10-CM

## 2015-02-10 DIAGNOSIS — F1099 Alcohol use, unspecified with unspecified alcohol-induced disorder: Secondary | ICD-10-CM

## 2015-02-10 DIAGNOSIS — M48062 Spinal stenosis, lumbar region with neurogenic claudication: Secondary | ICD-10-CM

## 2015-02-10 DIAGNOSIS — R200 Anesthesia of skin: Secondary | ICD-10-CM

## 2015-02-10 NOTE — Progress Notes (Signed)
[facility] Clinic Note - Initial Visit   Date: 02/10/2015   Joseph BaldyJack [name] MRN: [id number] DOB: 1956-09-24   Dear Dr. Laury AxonLowne:   Thank you for your kind referral of Joseph [name] for consultation of right leg numbness. Although his history is well known to you, please allow us to reiterate it for the purpose of our medical record. The patient was accompanied to the clinic by wife who also provides collateral information.     History of Present Illness: Joseph BaldyJack [name] is a 59 y.o. right-handed Caucasian male with hypertension, hyperlipidemia, and erectile dysfunction presenting for evaluation of episodic right leg numbness.    Starting around 2010, he started noticing numbness of toes, top of the foot, outer lower legs, thighs, and right buttocks which occurs every time he is standing greater 2-minutes. It does not involve his medial leg or thigh.  It most likely occurs when standing, but can occur with walking. It is always improved by sitting and feels immediate resolution of symptoms.  He denies low back pain, weakness, or similar symptoms of the left leg.  There is no tingling sensation.  Non discoloration of the foot.   Out-side paper records, electronic medical record, and images have been reviewed where available and summarized as:  Lab Results  Component Value Date   TSH 1.63 03/02/2012     Past Medical History  Diagnosis Date  . Hyperlipemia   . Hypertension     Past Surgical History  Procedure Laterality Date  . Exploratory laparotomy      for lacerated liver in high school  . Refractive surgery       Medications:  Current Outpatient Prescriptions on File Prior to Visit  Medication Sig Dispense Refill  . CIALIS 5 MG tablet Take 1 tablet (5 mg total) by mouth daily. 30 tablet 0  . cyclobenzaprine (FLEXERIL) 10 MG tablet Take 1 tablet (10 mg total) by mouth 3 (three) times daily as needed for muscle spasms. 30 tablet 0  . lisinopril  (PRINIVIL,ZESTRIL) 20 MG tablet Take 1 tablet (20 mg total) by mouth daily. Office visit due now 90 tablet 0  . meloxicam (MOBIC) 15 MG tablet Take 1 tablet (15 mg total) by mouth daily. 30 tablet 1   No current facility-administered medications on file prior to visit.    Allergies: No Known Allergies  Family History: Family History  Problem Relation Age of Onset  . Colon cancer    . Pancreatic cancer Father   . Arthritis Father   . Pancreatic cancer Father [id number]    Deceased  . Arthritis Mother   . Alzheimer's disease Mother     Living  . Cancer Mother [id number]    colon  . Allergic Disorder Sister   . Healthy Brother     Social History: History   Social History  . Marital Status: Married    Spouse Name: N/A  . Number of Children: N/A  . Years of Education: N/A   Occupational History  . Not on file.   Social History Main Topics  . Smoking status: Never Smoker   . Smokeless tobacco: Not on file  . Alcohol Use: 1.2 oz/week    2 Glasses of wine per week     Comment: daily - 2-3 bottles of wine per week  . Drug Use: No  . Sexual Activity:    Partners: Female   Other Topics Concern  . Not on file   Social History Narrative   Exercise ---  3 days a week--- elliptical, bicycle, weights---30-45 min   Lives with wife in a two story home.  Has 4 children.     Works as an Art gallery manager.     Education: college       Review of Systems:  CONSTITUTIONAL: No fevers, chills, night sweats, or weight loss.   EYES: No visual changes or eye pain ENT: No hearing changes.  No history of nose bleeds.   RESPIRATORY: No cough, wheezing and shortness of breath.   CARDIOVASCULAR: Negative for chest pain, and palpitations.   GI: Negative for abdominal discomfort, blood in stools or black stools.  No recent change in bowel habits.   GU:  No history of incontinence.   MUSCLOSKELETAL: No history of joint pain or swelling.  No myalgias.   SKIN: Negative for lesions, rash, and itching.     HEMATOLOGY/ONCOLOGY: Negative for prolonged bleeding, bruising easily, and swollen nodes.  No history of cancer.   ENDOCRINE: Negative for cold or heat intolerance, polydipsia or goiter.   PSYCH:  No depression or anxiety symptoms.   NEURO: As Above.   Vital Signs:  BP 118/78 mmHg  Pulse 77  Ht  (1.88 m)  Wt 269 lb 9 oz (122.273 kg)  BMI 34.60 kg/m2  SpO2 95% Pain Scale: 0 on a scale of 0-10   General Medical Exam:   General:  Well appearing, comfortable.   Eyes/ENT: see cranial nerve examination.   Neck: No masses appreciated.  Full range of motion without tenderness.  No carotid bruits. Respiratory:  Clear to auscultation, good air entry bilaterally.   Cardiac:  Regular rate and rhythm, no murmur.   Extremities:  No deformities, edema, or skin discoloration.  Skin:  No rashes or lesions.  Neurological Exam: MENTAL STATUS including orientation to time, place, person, recent and remote memory, attention span and concentration, language, and fund of knowledge is normal.  Speech is not dysarthric.  CRANIAL NERVES: II:  No visual field defects.  Unremarkable fundi.   III-IV-VI: Pupils equal round and reactive to light.  Normal conjugate, extra-ocular eye movements in all directions of gaze.  No nystagmus.  No ptosis.   V:  Normal facial sensation.     VII:  Normal facial symmetry and movements.  No pathologic facial reflexes.  VIII:  Normal hearing and vestibular function.   IX-X:  Normal palatal movement.   XI:  Normal shoulder shrug and head rotation.   XII:  Normal tongue strength and range of motion, no deviation or fasciculation.  MOTOR:  No atrophy, fasciculations or abnormal movements.  No pronator drift.  Tone is normal.    Right Upper Extremity:    Left Upper Extremity:    Deltoid  5/5   Deltoid  5/5   Biceps  5/5   Biceps  5/5   Triceps  5/5   Triceps  5/5   Wrist extensors  5/5   Wrist extensors  5/5   Wrist flexors  5/5   Wrist flexors  5/5   Finger  extensors  5/5   Finger extensors  5/5   Finger flexors  5/5   Finger flexors  5/5   Dorsal interossei  5/5   Dorsal interossei  5/5   Abductor pollicis  5/5   Abductor pollicis  5/5   Tone (Ashworth scale)  0  Tone (Ashworth scale)  0   Right Lower Extremity:    Left Lower Extremity:    Hip flexors  5/5   Hip  flexors  5/5   Hip extensors  5/5   Hip extensors  5/5   Knee flexors  5/5   Knee flexors  5/5   Knee extensors  5/5   Knee extensors  5/5   Dorsiflexors  5/5   Dorsiflexors  5/5   Plantarflexors  5/5   Plantarflexors  5/5   Toe extensors  5/5   Toe extensors  5/5   Toe flexors  5/5   Toe flexors  5/5   Tone (Ashworth scale)  0  Tone (Ashworth scale)  0   MSRs:  Right                                                                 Left brachioradialis 2+  brachioradialis 2+  biceps 2+  biceps 2+  triceps 2+  triceps 2+  patellar 2+  patellar 2+  ankle jerk 2+  ankle jerk 2+  Hoffman no  Hoffman no  plantar response down  plantar response down   SENSORY:  Normal and symmetric perception of light touch, pinprick, vibration, and proprioception.  Romberg's sign absent.   COORDINATION/GAIT: Normal finger-to- nose-finger and heel-to-shin.  Intact rapid alternating movements bilaterally.  Able to rise from a chair without using arms.  Gait narrow based and stable. Tandem and stressed gait intact.    IMPRESSION: Mr. [name] is a 59 year-old gentleman presenting for evaluation of right leg numbness with weight-bearing.  Symptoms started around 2010 and have become more frequent and now also occur when he is walking. His exam is entirely normal and non-focal.  An L5-radiculopathy is possible, but it is atypical for symptoms to start in the toes and then radiate to his buttocks.  Asymmetrical symptoms also makes neuropathy unlikely, although I did encourage him to cut back on his alcohol intake to prevent neuropathy.  Other possibilities include vascular insufficiency, but again would  expect exertional paresthesias.  I will first started with MRI lumbar spine without contrast and then look at peripheral vascular studies (ABI).      The duration of this appointment visit was 45 minutes of face-to-face time with the patient.  Greater than 50% of this time was spent in counseling, explanation of diagnosis, planning of further management, and coordination of care.   Thank you for allowing me to participate in patient's care.  If I can answer any additional questions, I would be pleased to do so.    Sincerely,    Jasmine Maceachern K. Allena Katz, DO

## 2015-02-10 NOTE — Patient Instructions (Addendum)
1.  MRI lumbar spine without contrast 2.  The next step will be to order vascular studies of the leg.  We will try to schedule this during the same week and can cancel, if needed.  3.  We will call you with the results of the testing

## 2015-02-12 ENCOUNTER — Ambulatory Visit (HOSPITAL_COMMUNITY): Payer: BLUE CROSS/BLUE SHIELD | Attending: Cardiology | Admitting: *Deleted

## 2015-02-12 DIAGNOSIS — M4806 Spinal stenosis, lumbar region: Secondary | ICD-10-CM | POA: Insufficient documentation

## 2015-02-12 DIAGNOSIS — R531 Weakness: Secondary | ICD-10-CM | POA: Insufficient documentation

## 2015-02-12 DIAGNOSIS — I10 Essential (primary) hypertension: Secondary | ICD-10-CM | POA: Insufficient documentation

## 2015-02-12 DIAGNOSIS — M48062 Spinal stenosis, lumbar region with neurogenic claudication: Secondary | ICD-10-CM

## 2015-02-12 DIAGNOSIS — R208 Other disturbances of skin sensation: Secondary | ICD-10-CM | POA: Diagnosis not present

## 2015-02-12 DIAGNOSIS — R29898 Other symptoms and signs involving the musculoskeletal system: Secondary | ICD-10-CM

## 2015-02-12 DIAGNOSIS — E785 Hyperlipidemia, unspecified: Secondary | ICD-10-CM | POA: Diagnosis not present

## 2015-02-12 DIAGNOSIS — R200 Anesthesia of skin: Secondary | ICD-10-CM | POA: Diagnosis present

## 2015-02-12 NOTE — Progress Notes (Signed)
Lower Extremity Arterial Doppler Study - Complete - Performed - ABI's > 1.0 bilaterally

## 2015-02-14 ENCOUNTER — Ambulatory Visit
Admission: RE | Admit: 2015-02-14 | Discharge: 2015-02-14 | Disposition: A | Discharge status: Home / Self Care | Payer: BLUE CROSS/BLUE SHIELD | Source: Ambulatory Visit | Attending: Neurology | Admitting: Neurology

## 2015-02-14 DIAGNOSIS — R29898 Other symptoms and signs involving the musculoskeletal system: Secondary | ICD-10-CM

## 2015-02-14 DIAGNOSIS — R200 Anesthesia of skin: Secondary | ICD-10-CM

## 2015-02-17 ENCOUNTER — Telehealth: Payer: Self-pay | Admitting: Neurology

## 2015-02-17 ENCOUNTER — Other Ambulatory Visit: Payer: Self-pay | Admitting: Family Medicine

## 2015-02-17 NOTE — Telephone Encounter (Signed)
I attempted to contact patient via phone today regarding the results of MRI lumbar spine, however there was no answer so a message was left for the patient to return my call.   Donika K. Patel, DO    

## 2015-02-18 ENCOUNTER — Other Ambulatory Visit: Payer: Self-pay | Admitting: *Deleted

## 2015-02-18 DIAGNOSIS — R202 Paresthesia of skin: Secondary | ICD-10-CM

## 2015-02-18 DIAGNOSIS — M5441 Lumbago with sciatica, right side: Secondary | ICD-10-CM

## 2015-05-22 ENCOUNTER — Other Ambulatory Visit: Payer: Self-pay | Admitting: Family Medicine

## 2015-07-22 ENCOUNTER — Encounter: Payer: Self-pay | Admitting: Family Medicine

## 2015-07-22 ENCOUNTER — Ambulatory Visit (INDEPENDENT_AMBULATORY_CARE_PROVIDER_SITE_OTHER): Payer: BLUE CROSS/BLUE SHIELD | Admitting: Family Medicine

## 2015-07-22 VITALS — BP 134/86 | HR 75 | Temp 98.9°F | Ht 74.0 in | Wt 268.6 lb

## 2015-07-22 DIAGNOSIS — E785 Hyperlipidemia, unspecified: Secondary | ICD-10-CM

## 2015-07-22 DIAGNOSIS — R5382 Chronic fatigue, unspecified: Secondary | ICD-10-CM

## 2015-07-22 DIAGNOSIS — Z0000 Encounter for general adult medical examination without abnormal findings: Secondary | ICD-10-CM | POA: Diagnosis not present

## 2015-07-22 DIAGNOSIS — N529 Male erectile dysfunction, unspecified: Secondary | ICD-10-CM

## 2015-07-22 DIAGNOSIS — I10 Essential (primary) hypertension: Secondary | ICD-10-CM | POA: Diagnosis not present

## 2015-07-22 DIAGNOSIS — Z23 Encounter for immunization: Secondary | ICD-10-CM | POA: Diagnosis not present

## 2015-07-22 DIAGNOSIS — Z418 Encounter for other procedures for purposes other than remedying health state: Secondary | ICD-10-CM

## 2015-07-22 DIAGNOSIS — Z299 Encounter for prophylactic measures, unspecified: Secondary | ICD-10-CM

## 2015-07-22 LAB — POCT URINALYSIS DIPSTICK
Bilirubin, UA: NEGATIVE
Glucose, UA: NEGATIVE
Ketones, UA: NEGATIVE
Leukocytes, UA: NEGATIVE
NITRITE UA: NEGATIVE
PROTEIN UA: NEGATIVE
RBC UA: NEGATIVE
SPEC GRAV UA: 1.015
UROBILINOGEN UA: 0.2
pH, UA: 7

## 2015-07-22 MED ORDER — TADALAFIL 5 MG PO TABS: ORAL_TABLET | ORAL | Status: DC

## 2015-07-22 MED ORDER — LISINOPRIL 20 MG PO TABS: ORAL_TABLET | ORAL | Status: DC

## 2015-07-22 NOTE — Patient Instructions (Signed)
Preventive Care for Adults A healthy lifestyle and preventive care can promote health and wellness. Preventive health guidelines for men include the following key practices:  A routine yearly physical is a good way to check with your health care provider about your health and preventative screening. It is a chance to share any concerns and updates on your health and to receive a thorough exam.  Visit your dentist for a routine exam and preventative care every 6 months. Brush your teeth twice a day and floss once a day. Good oral hygiene prevents tooth decay and gum disease.  The frequency of eye exams is based on your age, health, family medical history, use of contact lenses, and other factors. Follow your health care provider's recommendations for frequency of eye exams.  Eat a healthy diet. Foods such as vegetables, fruits, whole grains, low-fat dairy products, and lean protein foods contain the nutrients you need without too many calories. Decrease your intake of foods high in solid fats, added sugars, and salt. Eat the right amount of calories for you.Get information about a proper diet from your health care provider, if necessary.  Regular physical exercise is one of the most important things you can do for your health. Most adults should get at least 150 minutes of moderate-intensity exercise (any activity that increases your heart rate and causes you to sweat) each week. In addition, most adults need muscle-strengthening exercises on 2 or more days a week.  Maintain a healthy weight. The body mass index (BMI) is a screening tool to identify possible weight problems. It provides an estimate of body fat based on height and weight. Your health care provider can find your BMI and can help you achieve or maintain a healthy weight.For adults 20 years and older:  A BMI below 18.5 is considered underweight.  A BMI of 18.5 to 24.9 is normal.  A BMI of 25 to 29.9 is considered overweight.  A BMI  of 30 and above is considered obese.  Maintain normal blood lipids and cholesterol levels by exercising and minimizing your intake of saturated fat. Eat a balanced diet with plenty of fruit and vegetables. Blood tests for lipids and cholesterol should begin at age 50 and be repeated every 5 years. If your lipid or cholesterol levels are high, you are over 50, or you are at high risk for heart disease, you may need your cholesterol levels checked more frequently.Ongoing high lipid and cholesterol levels should be treated with medicines if diet and exercise are not working.  If you smoke, find out from your health care provider how to quit. If you do not use tobacco, do not start.  Lung cancer screening is recommended for adults aged 73-80 years who are at high risk for developing lung cancer because of a history of smoking. A yearly low-dose CT scan of the lungs is recommended for people who have at least a 30-pack-year history of smoking and are a current smoker or have quit within the past 15 years. A pack year of smoking is smoking an average of 1 pack of cigarettes a day for 1 year (for example: 1 pack a day for 30 years or 2 packs a day for 15 years). Yearly screening should continue until the smoker has stopped smoking for at least 15 years. Yearly screening should be stopped for people who develop a health problem that would prevent them from having lung cancer treatment.  If you choose to drink alcohol, do not have more than  2 drinks per day. One drink is considered to be 12 ounces (355 mL) of beer, 5 ounces (148 mL) of wine, or 1.5 ounces (44 mL) of liquor.  Avoid use of street drugs. Do not share needles with anyone. Ask for help if you need support or instructions about stopping the use of drugs.  High blood pressure causes heart disease and increases the risk of stroke. Your blood pressure should be checked at least every 1-2 years. Ongoing high blood pressure should be treated with  medicines, if weight loss and exercise are not effective.  If you are 45-79 years old, ask your health care provider if you should take aspirin to prevent heart disease.  Diabetes screening involves taking a blood sample to check your fasting blood sugar level. This should be done once every 3 years, after age 45, if you are within normal weight and without risk factors for diabetes. Testing should be considered at a younger age or be carried out more frequently if you are overweight and have at least 1 risk factor for diabetes.  Colorectal cancer can be detected and often prevented. Most routine colorectal cancer screening begins at the age of 50 and continues through age 75. However, your health care provider may recommend screening at an earlier age if you have risk factors for colon cancer. On a yearly basis, your health care provider may provide home test kits to check for hidden blood in the stool. Use of a small camera at the end of a tube to directly examine the colon (sigmoidoscopy or colonoscopy) can detect the earliest forms of colorectal cancer. Talk to your health care provider about this at age 50, when routine screening begins. Direct exam of the colon should be repeated every 5-10 years through age 75, unless early forms of precancerous polyps or small growths are found.  People who are at an increased risk for hepatitis B should be screened for this virus. You are considered at high risk for hepatitis B if:  You were born in a country where hepatitis B occurs often. Talk with your health care provider about which countries are considered high risk.  Your parents were born in a high-risk country and you have not received a shot to protect against hepatitis B (hepatitis B vaccine).  You have HIV or AIDS.  You use needles to inject street drugs.  You live with, or have sex with, someone who has hepatitis B.  You are a man who has sex with other men (MSM).  You get hemodialysis  treatment.  You take certain medicines for conditions such as cancer, organ transplantation, and autoimmune conditions.  Hepatitis C blood testing is recommended for all people born from 1945 through 1965 and any individual with known risks for hepatitis C.  Practice safe sex. Use condoms and avoid high-risk sexual practices to reduce the spread of sexually transmitted infections (STIs). STIs include gonorrhea, chlamydia, syphilis, trichomonas, herpes, HPV, and human immunodeficiency virus (HIV). Herpes, HIV, and HPV are viral illnesses that have no cure. They can result in disability, cancer, and death.  If you are at risk of being infected with HIV, it is recommended that you take a prescription medicine daily to prevent HIV infection. This is called preexposure prophylaxis (PrEP). You are considered at risk if:  You are a man who has sex with other men (MSM) and have other risk factors.  You are a heterosexual man, are sexually active, and are at increased risk for HIV infection.    You take drugs by injection.  You are sexually active with a partner who has HIV.  Talk with your health care provider about whether you are at high risk of being infected with HIV. If you choose to begin PrEP, you should first be tested for HIV. You should then be tested every 3 months for as long as you are taking PrEP.  A one-time screening for abdominal aortic aneurysm (AAA) and surgical repair of large AAAs by ultrasound are recommended for men ages 32 to 67 years who are current or former smokers.  Healthy men should no longer receive prostate-specific antigen (PSA) blood tests as part of routine cancer screening. Talk with your health care provider about prostate cancer screening.  Testicular cancer screening is not recommended for adult males who have no symptoms. Screening includes self-exam, a health care provider exam, and other screening tests. Consult with your health care provider about any symptoms  you have or any concerns you have about testicular cancer.  Use sunscreen. Apply sunscreen liberally and repeatedly throughout the day. You should seek shade when your shadow is shorter than you. Protect yourself by wearing long sleeves, pants, a wide-brimmed hat, and sunglasses year round, whenever you are outdoors.  Once a month, do a whole-body skin exam, using a mirror to look at the skin on your back. Tell your health care provider about new moles, moles that have irregular borders, moles that are larger than a pencil eraser, or moles that have changed in shape or color.  Stay current with required vaccines (immunizations).  Influenza vaccine. All adults should be immunized every year.  Tetanus, diphtheria, and acellular pertussis (Td, Tdap) vaccine. An adult who has not previously received Tdap or who does not know his vaccine status should receive 1 dose of Tdap. This initial dose should be followed by tetanus and diphtheria toxoids (Td) booster doses every 10 years. Adults with an unknown or incomplete history of completing a 3-dose immunization series with Td-containing vaccines should begin or complete a primary immunization series including a Tdap dose. Adults should receive a Td booster every 10 years.  Varicella vaccine. An adult without evidence of immunity to varicella should receive 2 doses or a second dose if he has previously received 1 dose.  Human papillomavirus (HPV) vaccine. Males aged 68-21 years who have not received the vaccine previously should receive the 3-dose series. Males aged 22-26 years may be immunized. Immunization is recommended through the age of 6 years for any male who has sex with males and did not get any or all doses earlier. Immunization is recommended for any person with an immunocompromised condition through the age of 49 years if he did not get any or all doses earlier. During the 3-dose series, the second dose should be obtained 4-8 weeks after the first  dose. The third dose should be obtained 24 weeks after the first dose and 16 weeks after the second dose.  Zoster vaccine. One dose is recommended for adults aged 50 years or older unless certain conditions are present.  Measles, mumps, and rubella (MMR) vaccine. Adults born before [birth year] generally are considered immune to measles and mumps. Adults born in [birth year] or later should have 1 or more doses of MMR vaccine unless there is a contraindication to the vaccine or there is laboratory evidence of immunity to each of the three diseases. A routine second dose of MMR vaccine should be obtained at least 28 days after the first dose for students attending postsecondary  schools, health care workers, or international travelers. People who received inactivated measles vaccine or an unknown type of measles vaccine during 1963-1967 should receive 2 doses of MMR vaccine. People who received inactivated mumps vaccine or an unknown type of mumps vaccine before 1979 and are at high risk for mumps infection should consider immunization with 2 doses of MMR vaccine. Unvaccinated health care workers born before 1957 who lack laboratory evidence of measles, mumps, or rubella immunity or laboratory confirmation of disease should consider measles and mumps immunization with 2 doses of MMR vaccine or rubella immunization with 1 dose of MMR vaccine.  Pneumococcal 13-valent conjugate (PCV13) vaccine. When indicated, a person who is uncertain of his immunization history and has no record of immunization should receive the PCV13 vaccine. An adult aged 19 years or older who has certain medical conditions and has not been previously immunized should receive 1 dose of PCV13 vaccine. This PCV13 should be followed with a dose of pneumococcal polysaccharide (PPSV23) vaccine. The PPSV23 vaccine dose should be obtained at least 8 weeks after the dose of PCV13 vaccine. An adult aged 19 years or older who has certain medical conditions and  previously received 1 or more doses of PPSV23 vaccine should receive 1 dose of PCV13. The PCV13 vaccine dose should be obtained 1 or more years after the last PPSV23 vaccine dose.  Pneumococcal polysaccharide (PPSV23) vaccine. When PCV13 is also indicated, PCV13 should be obtained first. All adults aged 65 years and older should be immunized. An adult younger than age 65 years who has certain medical conditions should be immunized. Any person who resides in a nursing home or long-term care facility should be immunized. An adult smoker should be immunized. People with an immunocompromised condition and certain other conditions should receive both PCV13 and PPSV23 vaccines. People with human immunodeficiency virus (HIV) infection should be immunized as soon as possible after diagnosis. Immunization during chemotherapy or radiation therapy should be avoided. Routine use of PPSV23 vaccine is not recommended for American Indians, [place] Natives, or people younger than 65 years unless there are medical conditions that require PPSV23 vaccine. When indicated, people who have unknown immunization and have no record of immunization should receive PPSV23 vaccine. One-time revaccination 5 years after the first dose of PPSV23 is recommended for people aged 19-64 years who have chronic kidney failure, nephrotic syndrome, asplenia, or immunocompromised conditions. People who received 1-2 doses of PPSV23 before age 65 years should receive another dose of PPSV23 vaccine at age 65 years or later if at least 5 years have passed since the previous dose. Doses of PPSV23 are not needed for people immunized with PPSV23 at or after age 65 years.  Meningococcal vaccine. Adults with asplenia or persistent complement component deficiencies should receive 2 doses of quadrivalent meningococcal conjugate (MenACWY-D) vaccine. The doses should be obtained at least 2 months apart. Microbiologists working with certain meningococcal bacteria,  military recruits, people at risk during an outbreak, and people who travel to or live in countries with a high rate of meningitis should be immunized. A first-year college student up through age 21 years who is living in a residence hall should receive a dose if he did not receive a dose on or after his 16th birthday. Adults who have certain high-risk conditions should receive one or more doses of vaccine.  Hepatitis A vaccine. Adults who wish to be protected from this disease, have certain high-risk conditions, work with hepatitis A-infected animals, work in hepatitis A research labs, or   travel to or work in countries with a high rate of hepatitis A should be immunized. Adults who were previously unvaccinated and who anticipate close contact with an international adoptee during the first 60 days after arrival in the [place] from a country with a high rate of hepatitis A should be immunized.  Hepatitis B vaccine. Adults should be immunized if they wish to be protected from this disease, have certain high-risk conditions, may be exposed to blood or other infectious body fluids, are household contacts or sex partners of hepatitis B positive people, are clients or workers in certain care facilities, or travel to or work in countries with a high rate of hepatitis B.  Haemophilus influenzae type b (Hib) vaccine. A previously unvaccinated person with asplenia or sickle cell disease or having a scheduled splenectomy should receive 1 dose of Hib vaccine. Regardless of previous immunization, a recipient of a hematopoietic stem cell transplant should receive a 3-dose series 6-12 months after his successful transplant. Hib vaccine is not recommended for adults with HIV infection. Preventive Service / Frequency Ages 52 to 17  Blood pressure check.** / Every 1 to 2 years.  Lipid and cholesterol check.** / Every 5 years beginning at age 69.  Hepatitis C blood test.** / For any individual with known risks for  hepatitis C.  Skin self-exam. / Monthly.  Influenza vaccine. / Every year.  Tetanus, diphtheria, and acellular pertussis (Tdap, Td) vaccine.** / Consult your health care provider. 1 dose of Td every 10 years.  Varicella vaccine.** / Consult your health care provider.  HPV vaccine. / 3 doses over 6 months, if 72 or younger.  Measles, mumps, rubella (MMR) vaccine.** / You need at least 1 dose of MMR if you were born in 1957 or later. You may also need a second dose.  Pneumococcal 13-valent conjugate (PCV13) vaccine.** / Consult your health care provider.  Pneumococcal polysaccharide (PPSV23) vaccine.** / 1 to 2 doses if you smoke cigarettes or if you have certain conditions.  Meningococcal vaccine.** / 1 dose if you are age 35 to 60 years and a Market researcher living in a residence hall, or have one of several medical conditions. You may also need additional booster doses.  Hepatitis A vaccine.** / Consult your health care provider.  Hepatitis B vaccine.** / Consult your health care provider.  Haemophilus influenzae type b (Hib) vaccine.** / Consult your health care provider. Ages 35 to 8  Blood pressure check.** / Every 1 to 2 years.  Lipid and cholesterol check.** / Every 5 years beginning at age 57.  Lung cancer screening. / Every year if you are aged 44-80 years and have a 30-pack-year history of smoking and currently smoke or have quit within the past 15 years. Yearly screening is stopped once you have quit smoking for at least 15 years or develop a health problem that would prevent you from having lung cancer treatment.  Fecal occult blood test (FOBT) of stool. / Every year beginning at age 55 and continuing until age 73. You may not have to do this test if you get a colonoscopy every 10 years.  Flexible sigmoidoscopy** or colonoscopy.** / Every 5 years for a flexible sigmoidoscopy or every 10 years for a colonoscopy beginning at age 28 and continuing until age  1.  Hepatitis C blood test.** / For all people born from [birth year] through 1965 and any individual with known risks for hepatitis C.  Skin self-exam. / Monthly.  Influenza vaccine. / Every  year.  Tetanus, diphtheria, and acellular pertussis (Tdap/Td) vaccine.** / Consult your health care provider. 1 dose of Td every 10 years.  Varicella vaccine.** / Consult your health care provider.  Zoster vaccine.** / 1 dose for adults aged 53 years or older.  Measles, mumps, rubella (MMR) vaccine.** / You need at least 1 dose of MMR if you were born in 1957 or later. You may also need a second dose.  Pneumococcal 13-valent conjugate (PCV13) vaccine.** / Consult your health care provider.  Pneumococcal polysaccharide (PPSV23) vaccine.** / 1 to 2 doses if you smoke cigarettes or if you have certain conditions.  Meningococcal vaccine.** / Consult your health care provider.  Hepatitis A vaccine.** / Consult your health care provider.  Hepatitis B vaccine.** / Consult your health care provider.  Haemophilus influenzae type b (Hib) vaccine.** / Consult your health care provider. Ages 77 and over  Blood pressure check.** / Every 1 to 2 years.  Lipid and cholesterol check.**/ Every 5 years beginning at age 85.  Lung cancer screening. / Every year if you are aged 55-80 years and have a 30-pack-year history of smoking and currently smoke or have quit within the past 15 years. Yearly screening is stopped once you have quit smoking for at least 15 years or develop a health problem that would prevent you from having lung cancer treatment.  Fecal occult blood test (FOBT) of stool. / Every year beginning at age 33 and continuing until age 11. You may not have to do this test if you get a colonoscopy every 10 years.  Flexible sigmoidoscopy** or colonoscopy.** / Every 5 years for a flexible sigmoidoscopy or every 10 years for a colonoscopy beginning at age 28 and continuing until age 73.  Hepatitis C blood  test.** / For all people born from [birth year] through 1965 and any individual with known risks for hepatitis C.  Abdominal aortic aneurysm (AAA) screening.** / A one-time screening for ages 50 to 27 years who are current or former smokers.  Skin self-exam. / Monthly.  Influenza vaccine. / Every year.  Tetanus, diphtheria, and acellular pertussis (Tdap/Td) vaccine.** / 1 dose of Td every 10 years.  Varicella vaccine.** / Consult your health care provider.  Zoster vaccine.** / 1 dose for adults aged 34 years or older.  Pneumococcal 13-valent conjugate (PCV13) vaccine.** / Consult your health care provider.  Pneumococcal polysaccharide (PPSV23) vaccine.** / 1 dose for all adults aged 63 years and older.  Meningococcal vaccine.** / Consult your health care provider.  Hepatitis A vaccine.** / Consult your health care provider.  Hepatitis B vaccine.** / Consult your health care provider.  Haemophilus influenzae type b (Hib) vaccine.** / Consult your health care provider. **Family history and personal history of risk and conditions may change your health care provider's recommendations. Document Released: 11/30/2001 Document Revised: 10/09/2013 Document Reviewed: 03/01/2011 [facility] Patient Information 2015 [place], [place]. This information is not intended to replace advice given to you by your health care provider. Make sure you discuss any questions you have with your health care provider.

## 2015-07-22 NOTE — Assessment & Plan Note (Signed)
See AV[facility] ghm utd Check labs 

## 2015-07-22 NOTE — Progress Notes (Signed)
Pre visit review using our clinic review tool, if applicable. No additional management support is needed unless otherwise documented below in the visit note. 

## 2015-07-22 NOTE — Progress Notes (Signed)
Patient ID: Joseph [name], male   DOB: Apr 12, 1956, 59 y.o.   MRN: [id number] Patient ID: Joseph [name], male    DOB: 27-Feb-1956  Age: 59 y.o. MRN: [id number]    Subjective:  Subjective HPI Joseph [name] presents for cpe.  No complaints.    Review of Systems  Constitutional: Negative.   HENT: Negative for congestion, ear pain, hearing loss, nosebleeds, postnasal drip, rhinorrhea, sinus pressure, sneezing and tinnitus.   Eyes: Negative for photophobia, discharge, itching and visual disturbance.  Respiratory: Negative.   Cardiovascular: Negative.   Gastrointestinal: Negative for abdominal pain, constipation, blood in stool, abdominal distention and anal bleeding.  Endocrine: Negative.   Genitourinary: Negative.   Musculoskeletal: Negative.   Skin: Negative.   Allergic/Immunologic: Negative.   Neurological: Negative for dizziness, weakness, light-headedness, numbness and headaches.  Psychiatric/Behavioral: Negative for suicidal ideas, confusion, sleep disturbance, dysphoric mood, decreased concentration and agitation. The patient is not nervous/anxious.   All other systems reviewed and are negative.   History Past Medical History  Diagnosis Date  . Hyperlipemia   . Hypertension     He has past surgical history that includes Exploratory laparotomy and Refractive surgery.   His family history includes Allergic Disorder in his sister; Alzheimer's disease in his mother; Arthritis in his father and mother; Cancer (age of onset: 62) in his mother; Colon cancer in an other family member; Healthy in his brother; Pancreatic cancer in his father; Pancreatic cancer (age of onset: 48) in his father.He reports that he has never smoked. He does not have any smokeless tobacco history on file. He reports that he drinks about 1.2 oz of alcohol per week. He reports that he does not use illicit drugs.  No current outpatient prescriptions on file prior to visit.   No current facility-administered  medications on file prior to visit.     Objective:  Objective Physical Exam  Constitutional: He is oriented to person, place, and time. He appears well-developed and well-nourished. No distress.  HENT:  Head: Normocephalic and atraumatic.  Right Ear: External ear normal.  Left Ear: External ear normal.  Nose: Nose normal.  Mouth/Throat: Oropharynx is clear and moist. No oropharyngeal exudate.  Eyes: Conjunctivae and EOM are normal. Pupils are equal, round, and reactive to light. Right eye exhibits no discharge. Left eye exhibits no discharge.  Neck: Normal range of motion. Neck supple. No JVD present. No thyromegaly present.  Cardiovascular: Normal rate, regular rhythm and intact distal pulses.  Exam reveals no gallop and no friction rub.   No murmur heard. Pulmonary/Chest: Effort normal and breath sounds normal. No respiratory distress. He has no wheezes. He has no rales. He exhibits no tenderness.  Abdominal: Soft. Bowel sounds are normal. He exhibits no distension and no mass. There is no tenderness. There is no rebound and no guarding.  Genitourinary: Rectum normal, prostate normal and penis normal. Guaiac negative stool.  Musculoskeletal: Normal range of motion. He exhibits no edema or tenderness.  Lymphadenopathy:    He has no cervical adenopathy.  Neurological: He is alert and oriented to person, place, and time. He displays normal reflexes. He exhibits normal muscle tone.  Skin: Skin is warm and dry. No rash noted. He is not diaphoretic. No erythema. No pallor.  Psychiatric: He has a normal mood and affect. His behavior is normal. Judgment and thought content normal.   BP 134/86 mmHg  Pulse 75  Temp(Src) 98.9 F (37.2 C) (Oral)  Ht  (1.88 m)  Wt 268 lb 9.6 oz (  121.836 kg)  BMI 34.47 kg/m2  SpO2 98% Wt Readings from Last 3 Encounters:  07/22/15 268 lb 9.6 oz (121.836 kg)  02/14/15 270 lb (122.471 kg)  02/10/15 269 lb 9 oz (122.273 kg)     Lab Results  Component  Value Date   WBC 8.1 06/06/2012   HGB 15.0 06/06/2012   HCT 44.8 06/06/2012   PLT 210.0 06/06/2012   GLUCOSE 96 03/25/2014   CHOL 149 03/25/2014   TRIG 98.0 03/25/2014   HDL 30.70* 03/25/2014   LDLCALC 99 03/25/2014   ALT 35 03/25/2014   AST 41* 03/25/2014   NA 137 03/25/2014   K 3.6 03/25/2014   CL 104 03/25/2014   CREATININE 0.8 03/25/2014   BUN 16 03/25/2014   CO2 27 03/25/2014   TSH 1.63 03/02/2012   PSA 2.30 03/02/2012   INR 1.15 12/07/2010   MICROALBUR 0.2 05/01/2013    Mr Lumbar Spine Wo Contrast  02/14/2015   CLINICAL DATA:  Chronic right leg numbness with standing, worsening.  EXAM: MRI LUMBAR SPINE WITHOUT CONTRAST  TECHNIQUE: Multiplanar, multisequence MR imaging of the lumbar spine was performed. No intravenous contrast was administered.  COMPARISON:  CT abdomen and pelvis 12/07/2010.  FINDINGS: Vertebral body height and alignment are maintained. No worrisome marrow lesion is identified. A few Schmorl's nodes are noted. Prominent epidural fat is identified. The conus medullaris is normal in signal and position. Imaged intra-abdominal contents are unremarkable.  The T11-12 level is imaged in the sagittal plane only and negative.  T12-L1:  Negative.  L1-2: A very shallow right paracentral protrusion is seen but the central canal and foramina are widely patent.  L2-3:  Negative.  L3-4: Minimal disc bulge is identified but the central canal and foramina appear open.  L4-5: Advanced facet degenerative disease is seen. There is a shallow broad-based disc bulge and ligamentum flavum thickening. Moderately severe to severe central canal stenosis is present and there is marked narrowing in the lateral recesses. Mild right foraminal narrowing is identified. The left foramen is open.  L5-S1: There is a minimal disc bulge. Crowding of descending nerve roots by epidural fat is identified. The foramina are open.  IMPRESSION: Spondylosis appearing worst at L4-5 where there is moderately severe  to severe central canal and bilateral lateral recess narrowing. Advanced facet degenerative disease is seen at this level.  Very shallow right paracentral protrusion L1-2 without central canal or foraminal narrowing.  Prominent epidural fat results in crowding of descending nerve roots at L5-S1.   Electronically Signed   By: Drusilla Kanner M.D.   On: 02/14/2015 16:37     Assessment & Plan:  Plan I have discontinued Mr. Hazelip cyclobenzaprine and meloxicam. I have changed his CIALIS to tadalafil. I have also changed his lisinopril.  Meds ordered this encounter  Medications  . lisinopril (PRINIVIL,ZESTRIL) 20 MG tablet    Sig: TAKE 1 TABLET BY MOUTH EVERY DAy    Dispense:  90 tablet    Refill:  3  . tadalafil (CIALIS) 5 MG tablet    Sig: Take 1 tablet (5 mg total) by mouth daily.    Dispense:  30 tablet    Refill:  5    Refill [id number]    Problem List Items Addressed This Visit    Preventative health care - Primary    See AVS ghm utd Check labs      Hyperlipidemia   Relevant Medications   lisinopril (PRINIVIL,ZESTRIL) 20 MG tablet   tadalafil (CIALIS) 5 MG  tablet   Other Relevant Orders   Lipid panel   POCT urinalysis dipstick (Completed)   Essential hypertension   Relevant Medications   lisinopril (PRINIVIL,ZESTRIL) 20 MG tablet   tadalafil (CIALIS) 5 MG tablet   Other Relevant Orders   CBC with Differential/Platelet   Comprehensive metabolic panel   TSH   POCT urinalysis dipstick (Completed)   ERECTILE DYSFUNCTION, ORGANIC   Relevant Medications   tadalafil (CIALIS) 5 MG tablet   Other Relevant Orders   PSA   Testosterone    Other Visit Diagnoses    Chronic fatigue        Relevant Orders    PSA    Testosterone    Need for prophylactic measure        Relevant Orders    Varicella-zoster vaccine subcutaneous (Completed)    Encounter for immunization          Flu and zostavax given Follow-up: Return in about 6 months (around 01/20/2016).  [name],  DO

## 2015-07-23 LAB — COMPREHENSIVE METABOLIC PANEL
ALT: 32 U/L (ref 0–53)
AST: 23 U/L (ref 0–37)
Albumin: 4.4 g/dL (ref 3.5–5.2)
Alkaline Phosphatase: 76 U/L (ref 39–117)
BUN: 10 mg/dL (ref 6–23)
CHLORIDE: 100 meq/L (ref 96–112)
CO2: 30 meq/L (ref 19–32)
Calcium: 9.8 mg/dL (ref 8.4–10.5)
Creatinine, Ser: 0.86 mg/dL (ref 0.40–1.50)
GFR: 97.44 mL/min (ref >60.00)
GLUCOSE: 91 mg/dL (ref 70–99)
POTASSIUM: 4.1 meq/L (ref 3.5–5.1)
SODIUM: 139 meq/L (ref 135–145)
Total Bilirubin: 0.8 mg/dL (ref 0.2–1.2)
Total Protein: 7.4 g/dL (ref 6.0–8.3)

## 2015-07-23 LAB — CBC WITH DIFFERENTIAL/PLATELET
BASOS PCT: 0.5 % (ref 0.0–3.0)
Basophils Absolute: 0 10*3/uL (ref 0.0–0.1)
EOS PCT: 2.5 % (ref 0.0–5.0)
Eosinophils Absolute: 0.2 10*3/uL (ref 0.0–0.7)
HCT: 46.8 % (ref 39.0–52.0)
Hemoglobin: 15.9 g/dL (ref 13.0–17.0)
LYMPHS ABS: 1.8 10*3/uL (ref 0.7–4.0)
Lymphocytes Relative: 23.7 % (ref 12.0–46.0)
MCHC: 33.9 g/dL (ref 30.0–36.0)
MCV: 95.2 fl (ref 78.0–100.0)
MONO ABS: 0.5 10*3/uL (ref 0.1–1.0)
MONOS PCT: 6.8 % (ref 3.0–12.0)
NEUTROS ABS: 5.1 10*3/uL (ref 1.4–7.7)
NEUTROS PCT: 66.5 % (ref 43.0–77.0)
PLATELETS: 200 10*3/uL (ref 150.0–400.0)
RBC: 4.92 Mil/uL (ref 4.22–5.81)
RDW: 13.1 % (ref 11.5–15.5)
WBC: 7.6 10*3/uL (ref 4.0–10.5)

## 2015-07-23 LAB — LIPID PANEL
Cholesterol: 188 mg/dL (ref 0–200)
HDL: 39.8 mg/dL (ref >39.00)
LDL CALC: 113 mg/dL — AB (ref 0–99)
NONHDL: 148.16
Total CHOL/HDL Ratio: 5
Triglycerides: 177 mg/dL — ABNORMAL HIGH (ref 0.0–149.0)
VLDL: 35.4 mg/dL (ref 0.0–40.0)

## 2015-07-23 LAB — PSA: PSA: 2.14 ng/mL (ref 0.10–4.00)

## 2015-07-23 LAB — TSH: TSH: 1.67 u[IU]/mL (ref 0.35–4.50)

## 2015-07-23 LAB — TESTOSTERONE: Testosterone: 336.62 ng/dL (ref 300.00–890.00)

## 2015-07-29 ENCOUNTER — Other Ambulatory Visit: Payer: Self-pay | Admitting: Family Medicine

## 2016-02-04 ENCOUNTER — Other Ambulatory Visit: Payer: Self-pay | Admitting: Family Medicine

## 2016-05-19 ENCOUNTER — Encounter: Payer: Self-pay | Admitting: Physician Assistant

## 2016-05-19 ENCOUNTER — Ambulatory Visit (HOSPITAL_BASED_OUTPATIENT_CLINIC_OR_DEPARTMENT_OTHER)
Admission: RE | Admit: 2016-05-19 | Discharge: 2016-05-19 | Disposition: A | Discharge status: Home / Self Care | Payer: BLUE CROSS/BLUE SHIELD | Source: Ambulatory Visit | Attending: Physician Assistant | Admitting: Physician Assistant

## 2016-05-19 ENCOUNTER — Other Ambulatory Visit: Payer: Self-pay | Admitting: Physician Assistant

## 2016-05-19 ENCOUNTER — Ambulatory Visit (INDEPENDENT_AMBULATORY_CARE_PROVIDER_SITE_OTHER): Payer: BLUE CROSS/BLUE SHIELD | Admitting: Physician Assistant

## 2016-05-19 VITALS — BP 116/84 | HR 86 | Temp 98.3°F | Resp 16 | Ht 74.0 in | Wt 256.0 lb

## 2016-05-19 DIAGNOSIS — M1712 Unilateral primary osteoarthritis, left knee: Secondary | ICD-10-CM | POA: Insufficient documentation

## 2016-05-19 DIAGNOSIS — M25562 Pain in left knee: Secondary | ICD-10-CM

## 2016-05-19 DIAGNOSIS — M25569 Pain in unspecified knee: Principal | ICD-10-CM

## 2016-05-19 DIAGNOSIS — G8929 Other chronic pain: Secondary | ICD-10-CM | POA: Diagnosis not present

## 2016-05-19 DIAGNOSIS — M179 Osteoarthritis of knee, unspecified: Secondary | ICD-10-CM | POA: Diagnosis not present

## 2016-05-19 NOTE — Progress Notes (Signed)
Patient presents to clinic today c/o 20+ years of intermittent L knee discomfort, mostly with walking up steps. Notes worsening of pain over the past 8 months, worse after golfing. Notes decreased strength in the knee. Worse with twisting motions. Endorses history of left knee arthroscopy 35 years ago after injury. Is not sure of injury or surgery. Denies swelling, numbness or tingling.  Patient has not taken anything for pain.   Past Medical History:  Diagnosis Date  . Hyperlipemia   . Hypertension     Current Outpatient Prescriptions on File Prior to Visit  Medication Sig Dispense Refill  . CIALIS 5 MG tablet TAKE 1 TABLET DAILY (Patient taking differently: TAKE 1 TABLET DAILY AS NEEDED) 30 tablet 0  . lisinopril (PRINIVIL,ZESTRIL) 20 MG tablet Take 1 tablet (20 mg total) by mouth daily. 30 tablet 5   No current facility-administered medications on file prior to visit.     No Known Allergies  Family History  Problem Relation Age of Onset  . Colon cancer    . Pancreatic cancer Father   . Arthritis Father   . Pancreatic cancer Father 28    Deceased  . Arthritis Mother   . Alzheimer's disease Mother     Living  . Cancer Mother 62    colon  . Allergic Disorder Sister   . Healthy Brother     Social History   Social History  . Marital status: Married    Spouse name: N/A  . Number of children: N/A  . Years of education: N/A   Social History Main Topics  . Smoking status: Never Smoker  . Smokeless tobacco: None  . Alcohol use 1.2 oz/week    2 Glasses of wine per week     Comment: daily - 2-3 bottles of wine per week  . Drug use: No  . Sexual activity: Yes    Partners: Female   Other Topics Concern  . None   Social History Narrative   Exercise --- 3 days a week--- elliptical, bicycle, weights---30-45 min   Lives with wife in a two story home.  Has 4 children.     Works as an Art gallery manager.     Education: college      Review of Systems - See HPI.  All other ROS  are negative.  BP 116/84 (BP Location: Left Arm, Patient Position: Sitting, Cuff Size: Large)   Pulse 86   Temp 98.3 F (36.8 C) (Oral)   Resp 16   Ht 6\' 2"  (1.88 m)   Wt 256 lb (116.1 kg)   SpO2 97%   BMI 32.87 kg/m   Physical Exam  Constitutional: He is well-developed, well-nourished, and in no distress.  HENT:  Head: Normocephalic and atraumatic.  Cardiovascular: Normal rate, regular rhythm, normal heart sounds and intact distal pulses.   Pulmonary/Chest: Effort normal and breath sounds normal. No respiratory distress. He has no wheezes. He has no rales. He exhibits no tenderness.  Musculoskeletal:       Right knee: Normal.       Left knee: He exhibits normal range of motion, no swelling, normal alignment, normal patellar mobility and no MCL laxity. Tenderness found. Medial joint line tenderness noted. No lateral joint line, no MCL, no LCL and no patellar tendon tenderness noted.  Vitals reviewed.  Assessment/Plan: 1. Chronic knee pain, left Will obtain x-ray today. Supportive measures and OTC pain medication reviewed. Limit exertion while workup in progress. Will likely need either MRI or sports medicine  assessment -- believe this is a combination of OA and soft tissue inflammation.  - DG Knee Complete 4 Views Left; Future   Piedad Climes, PA-C

## 2016-05-19 NOTE — Patient Instructions (Signed)
Please go downstairs for imaging. We will call with your results. I would recommend starting a daily Aleve, at least on golfing days. Apply Icy hot or Aspercreme to the knee.  A knee sleeve (can get at [facility] or pharmacy) may help with pain as is applies mild compression to the joint.  We will change regimen based on findings on x-ray. We may need further imaging with MRI.

## 2016-05-25 ENCOUNTER — Ambulatory Visit: Payer: BLUE CROSS/BLUE SHIELD | Attending: Physician Assistant | Admitting: Physical Therapy

## 2016-05-25 DIAGNOSIS — M25562 Pain in left knee: Secondary | ICD-10-CM | POA: Diagnosis not present

## 2016-05-25 DIAGNOSIS — M25662 Stiffness of left knee, not elsewhere classified: Secondary | ICD-10-CM | POA: Diagnosis not present

## 2016-05-25 NOTE — Therapy (Addendum)
[facility] [address]  [place], [place], [address] Phone: [phone]   Fax:  [phone]  Physical Therapy Evaluation  Patient Details  Name: Joseph [name] MRN: [id number] Date of Birth: May 13, 1956 Referring Provider: Brunetta Jeans, PA-C  Encounter Date: 05/25/2016      PT End of Session - 05/25/16 0935    Visit Number 1   Number of Visits 8   Date for PT Re-Evaluation 07/20/16   PT Start Time 0845   PT Stop Time 0935   PT Time Calculation (min) 50 min      Past Medical History:  Diagnosis Date  . Hyperlipemia   . Hypertension     Past Surgical History:  Procedure Laterality Date  . EXPLORATORY LAPAROTOMY     for lacerated liver in high school  . REFRACTIVE SURGERY      There were no vitals filed for this visit.       Subjective Assessment - 05/25/16 0851    Subjective Pt reports initially had water skiing accident ~30 yrs ago injuring L knee. Had surgery at the time (does not remember specifics). Over past 6 months, pt reports constant "tooth-achy" type pain in knee. Notes med/lat weakness with inability to pivot/twist over L LE which had altered his golf swing   Patient Stated Goals "To learn how to strengthen my knee on my own"   Currently in Pain? Yes   Pain Score 2   Least/Avg 2/10, Worst 6-7/10   Pain Location Knee   Pain Orientation Left   Pain Descriptors / Indicators Constant;Aching   Pain Type Chronic pain   Pain Onset 1 to 4 weeks ago   Pain Frequency Constant   Aggravating Factors  Pivoting/twisting on L leg   Pain Relieving Factors none   Effect of Pain on Daily Activities Limits golf swing, sometimes has to negotiate stairs with step-to pattern            [facility] PT Assessment - 05/25/16 0845      Assessment   Medical Diagnosis Chronic L knee pain   Referring Provider Brunetta Jeans, PA-C   Onset Date/Surgical Date --  6 months   Next MD Visit none scheduled    Prior Therapy none     Balance Screen   Has the patient fallen in the past 6 months No   Has the patient had a decrease in activity level because of a fear of falling?  No   Is the patient reluctant to leave their home because of a fear of falling?  No     Home Ecologist residence   Home Access Stairs to enter   Entrance Stairs-Number of Steps 5   Entrance Stairs-Rails Left   [place] Two level;Bed/bath upstairs     Prior Function   Level of Independence Independent   Vocation Full time employment   Administrator   Leisure Golf     Observation/Other Assessments   Focus on Therapeutic Outcomes (FOTO)  Knee 53% (47% limitaiton), predicted 65% (35% limitation)     ROM / Strength   AROM / PROM / Strength AROM;PROM;Strength     AROM   AROM Assessment Site Knee   Right/Left Knee Right;Left   Right Knee Extension 4   Right Knee Flexion 132   Left Knee Extension 0   Left Knee Flexion 121     PROM   PROM Assessment Site Knee  Right/Left Knee Left   Left Knee Extension 0   Left Knee Flexion 127     Strength   Strength Assessment Site Hip;Knee   Right/Left Hip Right;Left   Right Hip Flexion 5/5   Right Hip Extension 5/5   Right Hip External Rotation  5/5   Right Hip Internal Rotation 5/5   Right Hip ABduction 5/5   Right Hip ADduction 5/5   Left Hip Flexion 4+/5   Left Hip Extension 4+/5   Left Hip External Rotation 4/5  pain in knee   Left Hip Internal Rotation 4/5   Left Hip ABduction 4+/5   Left Hip ADduction 4+/5   Right/Left Knee Right;Left   Right Knee Flexion 5/5   Right Knee Extension 5/5   Left Knee Flexion 4+/5   Left Knee Extension 4+/5     Flexibility   Soft Tissue Assessment /Muscle Length yes   Hamstrings mild/mod tight on L   Quadriceps mild tightness B   ITB mild tightness B   Piriformis mild tightness B     Palpation   Patella mobility WFL   Palpation comment ttp along medial patella at  joint line         Today's Treatment  TherEx Initial HEP instruction:    L HS stretch with strap x30"    L ITB stretch with strap x30"    L KTOS stretch x30"    Prone quad stretch with strap x30"    Bridge + Hip ADD ball squeeze 10x5"    Bridge + Hip ABD isometric with black TB 10x5"    Hooklying Alt Hip ABD/ER with black TB 10x5"           PT Education - 05/25/16 0930    Education provided Yes   Education Details PT eval findings, POC & initial HEP   Person(s) Educated Patient   Methods Explanation;Demonstration;Handout   Comprehension Verbalized understanding;Returned demonstration;Need further instruction             PT Long Term Goals - 05/25/16 0935      PT LONG TERM GOAL #1   Title Pt will be independent with latest HEP by 07/20/16   Status New     PT LONG TERM GOAL #2   Title L knee AROM 0-125 or greater by 07/20/16   Status New     PT LONG TERM GOAL #3   Title L hip & knee strength 5/5 w/o pain by 07/20/16   Status New     PT LONG TERM GOAL #4   Title Pt able to ascend/descend stairs reciprocally w/o limitation due to L knee pain or weakness by 07/20/16   Status New     PT LONG TERM GOAL #5   Title Pt will report no limitation in golf swing due to L knee pain or weakness by 07/20/16   Status New               Plan - 05/25/16 0935    Clinical Impression Statement [name] is a 60 y/o male who presents to OP PT with ~6 month h/o worsening L knee pain and stiffness typically brought on by pivoting or twisting motions at the knee. Pt reports remote h/o of trauma to L knee from water skiing accident after which he did have surgery but cannot recall the specifics of the surgery. Pt describes pain as a constant "tooth-achy" type pain at 2/10 at time of eval on on average but but can be as high  as 6-7/10 at worst. Mild edema noted in anterior knee with slight ttp over medial patella at joint line. Assessment reveals limited AROM of L knee at 4-121 vs  0-132 in R knee, with L knee PROM 0-127. Mild tightness noted t/o proximal LE musculature, bilaterally in hamstrings, hip flexors, quads/rectus femoris, ITB & piriformis. L hip and knee MMT reveals weakness by up to 1 grade vs R (refer to above MMT). Pain limits pt functionally, causing him to avoid pivoting on L LE altering his golf swing and intermittently creates difficulty with ascending/descending stairs causing him to use a step-to pattern rather than reciprocal pattern on the stairs. POC will focus on improving proximal LE soft tissue pliability, core/LE strengthening with emphasis on L hip rotators and L HS & quad esp VMO, stability training with emphasis on rotational stability, and stair training, with manual therapy and modalities PRN for pain. Pt wanting to try 1x/wk frequency with emphasis on HEP.   Rehab Potential Good   PT Frequency 1x / week   PT Duration 8 weeks   PT Treatment/Interventions Patient/family education;Therapeutic exercise;Neuromuscular re-education;Balance training;Manual techniques;Taping;Therapeutic activities;Gait training;Stair training;Electrical Stimulation;Cryotherapy;Vasopneumatic Device;Iontophoresis 31m/ml Dexamethasone;ADLs/Self Care Home Management   PT Next Visit Plan Review initial HEP and progress weekly as appropriate; LE flexibility; LE strengthening with emphasis on rotational stability; Manual therapy, taping & modalities PRN   Consulted and Agree with Plan of Care Patient      Patient will benefit from skilled therapeutic intervention in order to improve the following deficits and impairments:  Pain, Impaired flexibility, Decreased range of motion, Decreased strength, Decreased activity tolerance  Visit Diagnosis: Pain in left knee  Stiffness of left knee, not elsewhere classified     Problem List Patient Active Problem List   Diagnosis Date Noted  . Preventative health care 07/22/2015  . Radicular low back pain 05/03/2014  . Obesity (BMI  30-39.9) 03/19/2014  . HEMATURIA UNSPECIFIED 12/07/2010  . ABDOMINAL PAIN RIGHT LOWER QUADRANT 12/07/2010  . ERECTILE DYSFUNCTION, ORGANIC 11/06/2010  . Essential hypertension 07/02/2008  . Hyperlipidemia 03/07/2007    JPercival Spanish PT, MPT 05/25/2016, 11:49 AM  [facility] [place] [address]Phone: [phone]  Fax:  [phone] Name: JIden StriplingMRN: [id number] of Birth: [birth year]  PHYSICAL THERAPY DISCHARGE SUMMARY  Visits from Start of Care: 1  Current functional level related to goals / functional outcomes:   Unable to assess as pt failed to return for any further sessions after the eval. As of 06/01/16, Mr. CKlevencalled and cancelled all of his appointments, asking to be taken off of the schedule until he sees his MD due to "having knee trouble" (reason for therapy episode). >30 days have passed w/o further communication from pt, therefore will proceed with D/C from PT for this episode.    Remaining deficits:   Unable to assess due to failure to return to PT.   Education / Equipment:    Initial HEP Plan: Patient agrees to discharge.  Patient goals were not met. Patient is being discharged due to not returning since the last visit.  ?????     JPercival Spanish PT, MPT 07/05/16, 4:15 PM  [facility] [place] [address]Phone: [phone]  Fax:  [phone]

## 2016-06-08 ENCOUNTER — Encounter: Payer: BLUE CROSS/BLUE SHIELD | Admitting: Physical Therapy

## 2016-06-22 ENCOUNTER — Ambulatory Visit: Payer: BLUE CROSS/BLUE SHIELD

## 2016-12-01 DIAGNOSIS — M170 Bilateral primary osteoarthritis of knee: Secondary | ICD-10-CM | POA: Insufficient documentation

## 2016-12-01 DIAGNOSIS — M1712 Unilateral primary osteoarthritis, left knee: Secondary | ICD-10-CM | POA: Diagnosis not present

## 2016-12-01 DIAGNOSIS — G8929 Other chronic pain: Secondary | ICD-10-CM | POA: Diagnosis not present

## 2016-12-01 DIAGNOSIS — M25562 Pain in left knee: Secondary | ICD-10-CM | POA: Diagnosis not present

## 2016-12-08 ENCOUNTER — Other Ambulatory Visit: Payer: Self-pay | Admitting: Family Medicine

## 2017-01-20 ENCOUNTER — Encounter: Payer: Self-pay | Admitting: Family Medicine

## 2017-01-20 ENCOUNTER — Ambulatory Visit (INDEPENDENT_AMBULATORY_CARE_PROVIDER_SITE_OTHER): Payer: BLUE CROSS/BLUE SHIELD | Admitting: Family Medicine

## 2017-01-20 VITALS — BP 118/78 | HR 77 | Temp 98.3°F | Resp 16 | Ht 73.7 in | Wt 263.8 lb

## 2017-01-20 DIAGNOSIS — Z1159 Encounter for screening for other viral diseases: Secondary | ICD-10-CM | POA: Diagnosis not present

## 2017-01-20 DIAGNOSIS — E785 Hyperlipidemia, unspecified: Secondary | ICD-10-CM | POA: Diagnosis not present

## 2017-01-20 DIAGNOSIS — N529 Male erectile dysfunction, unspecified: Secondary | ICD-10-CM

## 2017-01-20 DIAGNOSIS — Z114 Encounter for screening for human immunodeficiency virus [HIV]: Secondary | ICD-10-CM | POA: Diagnosis not present

## 2017-01-20 DIAGNOSIS — I10 Essential (primary) hypertension: Secondary | ICD-10-CM | POA: Diagnosis not present

## 2017-01-20 DIAGNOSIS — Z0000 Encounter for general adult medical examination without abnormal findings: Secondary | ICD-10-CM | POA: Diagnosis not present

## 2017-01-20 DIAGNOSIS — Z23 Encounter for immunization: Secondary | ICD-10-CM

## 2017-01-20 MED ORDER — LISINOPRIL 20 MG PO TABS: 20.0000 mg | ORAL_TABLET | Freq: Every day | ORAL | 0 refills | Status: DC

## 2017-01-20 NOTE — Patient Instructions (Signed)
Preventive Care 40-64 Years, Male Preventive care refers to lifestyle choices and visits with your health care provider that can promote health and wellness. What does preventive care include?  A yearly physical exam. This is also called an annual well check.  Dental exams once or twice a year.  Routine eye exams. Ask your health care provider how often you should have your eyes checked.  Personal lifestyle choices, including:  Daily care of your teeth and gums.  Regular physical activity.  Eating a healthy diet.  Avoiding tobacco and drug use.  Limiting alcohol use.  Practicing safe sex.  Taking low-dose aspirin every day starting at age 50. What happens during an annual well check? The services and screenings done by your health care provider during your annual well check will depend on your age, overall health, lifestyle risk factors, and family history of disease. Counseling  Your health care provider may ask you questions about your:  Alcohol use.  Tobacco use.  Drug use.  Emotional well-being.  Home and relationship well-being.  Sexual activity.  Eating habits.  Work and work environment. Screening  You may have the following tests or measurements:  Height, weight, and BMI.  Blood pressure.  Lipid and cholesterol levels. These may be checked every 5 years, or more frequently if you are over 50 years old.  Skin check.  Lung cancer screening. You may have this screening every year starting at age 55 if you have a 30-pack-year history of smoking and currently smoke or have quit within the past 15 years.  Fecal occult blood test (FOBT) of the stool. You may have this test every year starting at age 50.  Flexible sigmoidoscopy or colonoscopy. You may have a sigmoidoscopy every 5 years or a colonoscopy every 10 years starting at age 50.  Prostate cancer screening. Recommendations will vary depending on your family history and other risks.  Hepatitis C  blood test.  Hepatitis B blood test.  Sexually transmitted disease (STD) testing.  Diabetes screening. This is done by checking your blood sugar (glucose) after you have not eaten for a while (fasting). You may have this done every 1-3 years. Discuss your test results, treatment options, and if necessary, the need for more tests with your health care provider. Vaccines  Your health care provider may recommend certain vaccines, such as:  Influenza vaccine. This is recommended every year.  Tetanus, diphtheria, and acellular pertussis (Tdap, Td) vaccine. You may need a Td booster every 10 years.  Varicella vaccine. You may need this if you have not been vaccinated.  Zoster vaccine. You may need this after age 60.  Measles, mumps, and rubella (MMR) vaccine. You may need at least one dose of MMR if you were born in 1957 or later. You may also need a second dose.  Pneumococcal 13-valent conjugate (PCV13) vaccine. You may need this if you have certain conditions and have not been vaccinated.  Pneumococcal polysaccharide (PPSV23) vaccine. You may need one or two doses if you smoke cigarettes or if you have certain conditions.  Meningococcal vaccine. You may need this if you have certain conditions.  Hepatitis A vaccine. You may need this if you have certain conditions or if you travel or work in places where you may be exposed to hepatitis A.  Hepatitis B vaccine. You may need this if you have certain conditions or if you travel or work in places where you may be exposed to hepatitis B.  Haemophilus influenzae type b (Hib)   vaccine. You may need this if you have certain risk factors. Talk to your health care provider about which screenings and vaccines you need and how often you need them. This information is not intended to replace advice given to you by your health care provider. Make sure you discuss any questions you have with your health care provider. Document Released: 10/31/2015  Document Revised: 06/23/2016 Document Reviewed: 08/05/2015 Elsevier Interactive Patient Education  2017 [employer].

## 2017-01-20 NOTE — Assessment & Plan Note (Signed)
Encouraged heart healthy diet, increase exercise, avoid trans fats, consider a krill oil cap daily 

## 2017-01-20 NOTE — Progress Notes (Signed)
Pre visit review using our clinic review tool, if applicable. No additional management support is needed unless otherwise documented below in the visit note. 

## 2017-01-20 NOTE — Assessment & Plan Note (Signed)
ghm utd Check labs con't meds See AVS

## 2017-01-20 NOTE — Progress Notes (Signed)
Patient ID: Joseph [name], male   DOB: Oct 23, 1955, 61 y.o.   MRN: [id number]     Subjective:  I acted as a Neurosurgeon for Dr. Zola Button.  Apolonio Schneiders, CMA   Patient ID: Joseph [name], male    DOB: 27-Feb-1956, 61 y.o.   MRN: [id number]  Chief Complaint  Patient presents with  . Annual Exam    HPI  Patient is in today for annual exam.   No other complaints.    Patient Care Team: [name], DO as PCP - General   Past Medical History:  Diagnosis Date  . Hyperlipemia   . Hypertension     Past Surgical History:  Procedure Laterality Date  . EXPLORATORY LAPAROTOMY     for lacerated liver in high school  . REFRACTIVE SURGERY      Family History  Problem Relation Age of Onset  . Colon cancer    . Pancreatic cancer Father   . Arthritis Father   . Pancreatic cancer Father 6    Deceased  . Arthritis Mother   . Alzheimer's disease Mother     Living  . Cancer Mother 36    colon  . Allergic Disorder Sister   . Healthy Brother     Social History   Social History  . Marital status: Married    Spouse name: N/A  . Number of children: 4  . Years of education: N/A   Occupational History  . Not on file.   Social History Main Topics  . Smoking status: Never Smoker  . Smokeless tobacco: Never Used  . Alcohol use 1.2 oz/week    2 Glasses of wine per week     Comment: daily - 2-3 bottles of wine per week  . Drug use: No  . Sexual activity: Yes    Partners: Female   Other Topics Concern  . Not on file   Social History Narrative   Exercise - rarely   Lives with wife in a two story home.  Has 4 children.     Works as an Art gallery manager.     Education: college       Outpatient Medications Prior to Visit  Medication Sig Dispense Refill  . aspirin 81 MG tablet Take 81 mg by mouth daily.    . Omega-3 Fatty Acids (FISH OIL) 1000 MG CAPS Take by mouth daily.    Marland Kitchen lisinopril (PRINIVIL,ZESTRIL) 20 MG tablet TAKE 1 TABLET BY MOUTH EVERY DAY 90 tablet 0  . CIALIS 5 MG tablet  TAKE 1 TABLET DAILY (Patient not taking: Reported on 05/25/2016) 30 tablet 0   No facility-administered medications prior to visit.     No Known Allergies  Review of Systems  Constitutional: Negative for fever and malaise/fatigue.  HENT: Negative for congestion.   Eyes: Negative for blurred vision.  Respiratory: Negative for cough and shortness of breath.   Cardiovascular: Negative for chest pain, palpitations and leg swelling.  Gastrointestinal: Negative for vomiting.  Musculoskeletal: Negative for back pain.  Skin: Negative for rash.  Neurological: Negative for loss of consciousness and headaches.       Objective:    Physical Exam  Constitutional: He is oriented to person, place, and time. He appears well-developed and well-nourished. No distress.  HENT:  Head: Normocephalic and atraumatic.  Right Ear: External ear normal.  Left Ear: External ear normal.  Nose: Nose normal.  Mouth/Throat: Oropharynx is clear and moist. No oropharyngeal exudate.  Eyes: Conjunctivae and EOM are normal. Pupils are equal,  round, and reactive to light. Right eye exhibits no discharge. Left eye exhibits no discharge.  Neck: Normal range of motion. Neck supple. No JVD present. No thyromegaly present.  Cardiovascular: Normal rate, regular rhythm and intact distal pulses.  Exam reveals no gallop and no friction rub.   No murmur heard. Pulmonary/Chest: Effort normal and breath sounds normal. No respiratory distress. He has no wheezes. He has no rales. He exhibits no tenderness.  Abdominal: Soft. Bowel sounds are normal. He exhibits no distension and no mass. There is no tenderness. There is no rebound and no guarding.  Genitourinary: Rectum normal and prostate normal. Rectal exam shows guaiac negative stool. No penile tenderness.  Musculoskeletal: Normal range of motion. He exhibits no edema, tenderness or deformity.  Lymphadenopathy:    He has no cervical adenopathy.  Neurological: He is alert and  oriented to person, place, and time. He displays normal reflexes. He exhibits normal muscle tone.  Skin: Skin is warm and dry. No rash noted. He is not diaphoretic. No erythema. No pallor.  Psychiatric: He has a normal mood and affect. His behavior is normal. Judgment and thought content normal.  Nursing note and vitals reviewed.   BP 118/78 (BP Location: Left Arm, Cuff Size: Large)   Pulse 77   Temp 98.3 F (36.8 C) (Oral)   Resp 16   Ht 6' 1.7" (1.872 m)   Wt 263 lb 12.8 oz (119.7 kg)   SpO2 96%   BMI 34.15 kg/m  Wt Readings from Last 3 Encounters:  01/20/17 263 lb 12.8 oz (119.7 kg)  05/19/16 256 lb (116.1 kg)  07/22/15 268 lb 9.6 oz (121.8 kg)   BP Readings from Last 3 Encounters:  01/20/17 118/78  05/19/16 116/84  07/22/15 134/86     Immunization History  Administered Date(s) Administered  . Influenza Whole 07/18/2008  . Influenza,inj,Quad PF,36+ Mos 07/22/2015  . Influenza-Unspecified 12/01/2016  . Td 11/06/2010  . Zoster 07/22/2015  . Zoster Recombinat (Shingrix) 01/20/2017    Health Maintenance  Topic Date Due  . Hepatitis C Screening  05/[birth year]  . HIV Screening  08/08/1973  . INFLUENZA VACCINE  05/18/2017  . TETANUS/TDAP  11/06/2020  . COLONOSCOPY  10/19/2023    Lab Results  Component Value Date   WBC 7.6 07/22/2015   HGB 15.9 07/22/2015   HCT 46.8 07/22/2015   PLT 200.0 07/22/2015   GLUCOSE 91 07/22/2015   CHOL 188 07/22/2015   TRIG 177.0 (H) 07/22/2015   HDL 39.80 07/22/2015   LDLCALC 113 (H) 07/22/2015   ALT 32 07/22/2015   AST 23 07/22/2015   NA 139 07/22/2015   K 4.1 07/22/2015   CL 100 07/22/2015   CREATININE 0.86 07/22/2015   BUN 10 07/22/2015   CO2 30 07/22/2015   TSH 1.67 07/22/2015   PSA 2.14 07/22/2015   INR 1.15 12/07/2010   MICROALBUR 0.2 05/01/2013    Lab Results  Component Value Date   TSH 1.67 07/22/2015   Lab Results  Component Value Date   WBC 7.6 07/22/2015   HGB 15.9 07/22/2015   HCT 46.8 07/22/2015   MCV  95.2 07/22/2015   PLT 200.0 07/22/2015   Lab Results  Component Value Date   NA 139 07/22/2015   K 4.1 07/22/2015   CO2 30 07/22/2015   GLUCOSE 91 07/22/2015   BUN 10 07/22/2015   CREATININE 0.86 07/22/2015   BILITOT 0.8 07/22/2015   ALKPHOS 76 07/22/2015   AST 23 07/22/2015   ALT 32 07/22/2015  PROT 7.4 07/22/2015   ALBUMIN 4.4 07/22/2015   CALCIUM 9.8 07/22/2015   GFR 97.44 07/22/2015   Lab Results  Component Value Date   CHOL 188 07/22/2015   Lab Results  Component Value Date   HDL 39.80 07/22/2015   Lab Results  Component Value Date   LDLCALC 113 (H) 07/22/2015   Lab Results  Component Value Date   TRIG 177.0 (H) 07/22/2015   Lab Results  Component Value Date   CHOLHDL 5 07/22/2015   No results found for: HGBA1C       Assessment & Plan:   Problem List Items Addressed This Visit      Unprioritized   ERECTILE DYSFUNCTION, ORGANIC   Relevant Orders   PSA   Essential hypertension    Well controlled, no changes to meds. Encouraged heart healthy diet such as the DASH diet and exercise as tolerated.       Relevant Medications   lisinopril (PRINIVIL,ZESTRIL) 20 MG tablet   Other Relevant Orders   CBC with Differential/Platelet   Comprehensive metabolic panel   Hyperlipidemia    Encouraged heart healthy diet, increase exercise, avoid trans fats, consider a krill oil cap daily      Relevant Medications   lisinopril (PRINIVIL,ZESTRIL) 20 MG tablet   Other Relevant Orders   CBC with Differential/Platelet   Lipid panel   Preventative health care - Primary    ghm utd Check labs con't meds See AVS      Relevant Orders   POCT Urinalysis Dipstick (Automated)   CBC with Differential/Platelet   TSH   PSA    Other Visit Diagnoses    Need for shingles vaccine       Relevant Orders   Varicella-zoster vaccine IM (Shingrix) (Completed)   Encounter for hepatitis C screening test for low risk patient       Relevant Orders   Hepatitis C antibody    Screening for HIV (human immunodeficiency virus)       Relevant Orders   HIV antibody      I have discontinued Mr. Charley Miske. I have also changed his lisinopril. Additionally, I am having him maintain his Fish Oil and aspirin.  Meds ordered this encounter  Medications  . lisinopril (PRINIVIL,ZESTRIL) 20 MG tablet    Sig: Take 1 tablet (20 mg total) by mouth daily.    Dispense:  90 tablet    Refill:  0    CMA served as scribe during this visit. History, Physical and Plan performed by medical provider. Documentation and orders reviewed and attested to.  [name], DO

## 2017-01-20 NOTE — Assessment & Plan Note (Signed)
Well controlled, no changes to meds. Encouraged heart healthy diet such as the DASH diet and exercise as tolerated.  °

## 2017-01-21 LAB — CBC WITH DIFFERENTIAL/PLATELET
BASOS PCT: 0.6 % (ref 0.0–3.0)
Basophils Absolute: 0 10*3/uL (ref 0.0–0.1)
EOS PCT: 1.7 % (ref 0.0–5.0)
Eosinophils Absolute: 0.1 10*3/uL (ref 0.0–0.7)
HCT: 45.6 % (ref 39.0–52.0)
Hemoglobin: 15.7 g/dL (ref 13.0–17.0)
LYMPHS ABS: 2.1 10*3/uL (ref 0.7–4.0)
Lymphocytes Relative: 27.2 % (ref 12.0–46.0)
MCHC: 34.4 g/dL (ref 30.0–36.0)
MCV: 95.4 fl (ref 78.0–100.0)
MONO ABS: 0.7 10*3/uL (ref 0.1–1.0)
MONOS PCT: 8.4 % (ref 3.0–12.0)
NEUTROS ABS: 4.8 10*3/uL (ref 1.4–7.7)
NEUTROS PCT: 62.1 % (ref 43.0–77.0)
PLATELETS: 205 10*3/uL (ref 150.0–400.0)
RBC: 4.78 Mil/uL (ref 4.22–5.81)
RDW: 12.7 % (ref 11.5–15.5)
WBC: 7.7 10*3/uL (ref 4.0–10.5)

## 2017-01-21 LAB — COMPREHENSIVE METABOLIC PANEL
ALK PHOS: 79 U/L (ref 39–117)
ALT: 28 U/L (ref 0–53)
AST: 18 U/L (ref 0–37)
Albumin: 4.6 g/dL (ref 3.5–5.2)
BUN: 12 mg/dL (ref 6–23)
CHLORIDE: 101 meq/L (ref 96–112)
CO2: 29 mEq/L (ref 19–32)
Calcium: 9.6 mg/dL (ref 8.4–10.5)
Creatinine, Ser: 0.93 mg/dL (ref 0.40–1.50)
GFR: 88.56 mL/min (ref >60.00)
GLUCOSE: 93 mg/dL (ref 70–99)
Potassium: 4 mEq/L (ref 3.5–5.1)
SODIUM: 138 meq/L (ref 135–145)
Total Bilirubin: 0.8 mg/dL (ref 0.2–1.2)
Total Protein: 7.2 g/dL (ref 6.0–8.3)

## 2017-01-21 LAB — LIPID PANEL
CHOLESTEROL: 205 mg/dL — AB (ref 0–200)
HDL: 39.7 mg/dL (ref >39.00)
LDL CALC: 139 mg/dL — AB (ref 0–99)
NonHDL: 164.95
Total CHOL/HDL Ratio: 5
Triglycerides: 131 mg/dL (ref 0.0–149.0)
VLDL: 26.2 mg/dL (ref 0.0–40.0)

## 2017-01-21 LAB — HEPATITIS C ANTIBODY: HCV AB: NEGATIVE

## 2017-01-21 LAB — HIV ANTIBODY (ROUTINE TESTING W REFLEX): HIV 1&2 Ab, 4th Generation: NONREACTIVE

## 2017-01-21 LAB — PSA: PSA: 2.59 ng/mL (ref 0.10–4.00)

## 2017-01-21 LAB — TSH: TSH: 1.7 u[IU]/mL (ref 0.35–4.50)

## 2017-02-25 ENCOUNTER — Other Ambulatory Visit: Payer: Self-pay | Admitting: Family Medicine

## 2017-03-22 ENCOUNTER — Telehealth: Payer: Self-pay

## 2017-03-22 ENCOUNTER — Ambulatory Visit: Payer: BLUE CROSS/BLUE SHIELD

## 2017-03-22 NOTE — Telephone Encounter (Signed)
We should have it by later this week - Monday

## 2017-03-22 NOTE — Telephone Encounter (Signed)
Patient had appointment scheduled for Shingrix immunization tomorrow. Called patient to inform we do not have on hand. Patient would like for us to call him when we receive immunization.

## 2017-03-23 ENCOUNTER — Ambulatory Visit: Payer: BLUE CROSS/BLUE SHIELD

## 2017-03-31 ENCOUNTER — Ambulatory Visit: Payer: BLUE CROSS/BLUE SHIELD

## 2017-03-31 ENCOUNTER — Telehealth: Payer: Self-pay

## 2017-03-31 NOTE — Telephone Encounter (Signed)
Called patient to inform him that the Shingrix Immunization has come in today. Appointment scheduled for today at 4pm.

## 2017-05-02 DIAGNOSIS — M170 Bilateral primary osteoarthritis of knee: Secondary | ICD-10-CM | POA: Diagnosis not present

## 2017-06-17 ENCOUNTER — Telehealth: Payer: Self-pay

## 2017-06-17 NOTE — Telephone Encounter (Signed)
Pt returned call. He said that he will be out of town for a few weeks. He will call back to schedule.

## 2017-06-17 NOTE — Telephone Encounter (Signed)
Called patient to schedule 2nd shingrix vaccine.  No answer.  Left a message for call back.    Schedulers--when patient calls back, please schedule nurse visit for 2nd shingrix before October 3rd. Thanks.   

## 2017-07-22 NOTE — Telephone Encounter (Signed)
Attempted to reach pt and he was not available per spouse. Advised her I would send message via mychart if pt would check that and respond back. Message sent.

## 2017-08-04 ENCOUNTER — Other Ambulatory Visit: Payer: Self-pay | Admitting: Family Medicine

## 2017-08-09 ENCOUNTER — Other Ambulatory Visit: Payer: Self-pay | Admitting: Emergency Medicine

## 2017-08-09 MED ORDER — LISINOPRIL 20 MG PO TABS: 20.0000 mg | ORAL_TABLET | Freq: Every day | ORAL | 1 refills | Status: DC

## 2018-01-25 ENCOUNTER — Other Ambulatory Visit: Payer: Self-pay | Admitting: Family Medicine

## 2018-02-03 ENCOUNTER — Encounter: Payer: Self-pay | Admitting: Family Medicine

## 2018-02-06 NOTE — Telephone Encounter (Signed)
We can do hep a and typhoid does come in pill form if he wants that over shot--- any other vaccines other then what we normally give [facility] would [facility]--- there is one I ID shingrix-- if we gave him the first one we should have one for him

## 2018-02-06 NOTE — Telephone Encounter (Signed)
Is it ok to get second dose?  1st dose was given 01/20/18.  There has been multiple attempts to get him in for his second dose.

## 2018-02-07 ENCOUNTER — Encounter: Payer: Self-pay | Admitting: Family Medicine

## 2018-02-07 ENCOUNTER — Ambulatory Visit (INDEPENDENT_AMBULATORY_CARE_PROVIDER_SITE_OTHER): Payer: BLUE CROSS/BLUE SHIELD | Admitting: Family Medicine

## 2018-02-07 VITALS — BP 130/80 | HR 81 | Temp 98.4°F | Resp 16 | Ht 74.0 in | Wt 269.8 lb

## 2018-02-07 DIAGNOSIS — I10 Essential (primary) hypertension: Secondary | ICD-10-CM | POA: Diagnosis not present

## 2018-02-07 DIAGNOSIS — E785 Hyperlipidemia, unspecified: Secondary | ICD-10-CM

## 2018-02-07 DIAGNOSIS — Z23 Encounter for immunization: Secondary | ICD-10-CM | POA: Diagnosis not present

## 2018-02-07 DIAGNOSIS — Z0000 Encounter for general adult medical examination without abnormal findings: Secondary | ICD-10-CM

## 2018-02-07 LAB — CBC WITH DIFFERENTIAL/PLATELET
BASOS ABS: 0 10*3/uL (ref 0.0–0.1)
Basophils Relative: 0.6 % (ref 0.0–3.0)
Eosinophils Absolute: 0.2 10*3/uL (ref 0.0–0.7)
Eosinophils Relative: 2.3 % (ref 0.0–5.0)
HEMATOCRIT: 44.9 % (ref 39.0–52.0)
Hemoglobin: 15.5 g/dL (ref 13.0–17.0)
LYMPHS PCT: 29.3 % (ref 12.0–46.0)
Lymphs Abs: 2.2 10*3/uL (ref 0.7–4.0)
MCHC: 34.4 g/dL (ref 30.0–36.0)
MCV: 94.3 fl (ref 78.0–100.0)
MONOS PCT: 7.6 % (ref 3.0–12.0)
Monocytes Absolute: 0.6 10*3/uL (ref 0.1–1.0)
NEUTROS ABS: 4.5 10*3/uL (ref 1.4–7.7)
Neutrophils Relative %: 60.2 % (ref 43.0–77.0)
PLATELETS: 202 10*3/uL (ref 150.0–400.0)
RBC: 4.77 Mil/uL (ref 4.22–5.81)
RDW: 13 % (ref 11.5–15.5)
WBC: 7.4 10*3/uL (ref 4.0–10.5)

## 2018-02-07 LAB — COMPREHENSIVE METABOLIC PANEL
ALT: 27 U/L (ref 0–53)
AST: 21 U/L (ref 0–37)
Albumin: 4.4 g/dL (ref 3.5–5.2)
Alkaline Phosphatase: 79 U/L (ref 39–117)
BUN: 10 mg/dL (ref 6–23)
CHLORIDE: 102 meq/L (ref 96–112)
CO2: 31 meq/L (ref 19–32)
Calcium: 9.4 mg/dL (ref 8.4–10.5)
Creatinine, Ser: 0.78 mg/dL (ref 0.40–1.50)
GFR: 108.1 mL/min (ref >60.00)
GLUCOSE: 95 mg/dL (ref 70–99)
Potassium: 4.1 mEq/L (ref 3.5–5.1)
SODIUM: 138 meq/L (ref 135–145)
Total Bilirubin: 0.7 mg/dL (ref 0.2–1.2)
Total Protein: 6.9 g/dL (ref 6.0–8.3)

## 2018-02-07 LAB — LIPID PANEL
CHOL/HDL RATIO: 4
Cholesterol: 179 mg/dL (ref 0–200)
HDL: 40.2 mg/dL (ref >39.00)
LDL CALC: 117 mg/dL — AB (ref 0–99)
NONHDL: 138.88
Triglycerides: 109 mg/dL (ref 0.0–149.0)
VLDL: 21.8 mg/dL (ref 0.0–40.0)

## 2018-02-07 LAB — TSH: TSH: 1.8 u[IU]/mL (ref 0.35–4.50)

## 2018-02-07 LAB — PSA: PSA: 2.92 ng/mL (ref 0.10–4.00)

## 2018-02-07 MED ORDER — TYPHOID VACCINE PO CPDR: 1.0000 | DELAYED_RELEASE_CAPSULE | ORAL | 0 refills | Status: DC

## 2018-02-07 NOTE — Assessment & Plan Note (Signed)
ghm utd Check labs  See AVS Hep a #1 given  shingrix #2 given Typhoid sent to pharmacy Pt going to [place]

## 2018-02-07 NOTE — Assessment & Plan Note (Signed)
Well controlled, no changes to meds. Encouraged heart healthy diet such as the DASH diet and exercise as tolerated.  °

## 2018-02-07 NOTE — Patient Instructions (Signed)

## 2018-02-07 NOTE — Progress Notes (Signed)
Patient ID: Joseph [name], male    DOB: [birth year]  Age: 62 y.o. MRN: [id number]    Subjective:  Subjective  HPI Joseph [name] presents for cpe and labs.   He is going to [place] and needs hep a and typhoid vaccines.   No complaints.     Review of Systems  Constitutional: Negative for chills and fever.  HENT: Negative for congestion and hearing loss.   Eyes: Negative for discharge.  Respiratory: Negative for cough and shortness of breath.   Cardiovascular: Negative for chest pain, palpitations and leg swelling.  Gastrointestinal: Negative for abdominal pain, blood in stool, constipation, diarrhea, nausea and vomiting.  Genitourinary: Negative for dysuria, frequency, hematuria and urgency.  Musculoskeletal: Negative for back pain and myalgias.  Skin: Negative for rash.  Allergic/Immunologic: Negative for environmental allergies.  Neurological: Negative for dizziness, weakness and headaches.  Hematological: Does not bruise/bleed easily.  Psychiatric/Behavioral: Negative for suicidal ideas. The patient is not nervous/anxious.     History Past Medical History:  Diagnosis Date  . Hyperlipemia   . Hypertension     He has a past surgical history that includes Exploratory laparotomy and Refractive surgery.   His family history includes Allergic Disorder in his sister; Alzheimer's disease in his mother; Arthritis in his father and mother; Cancer (age of onset: 668) in his mother; Colon cancer in his unknown relative; Healthy in his brother; Pancreatic cancer (age of onset: 5980) in his father.He reports that he has never smoked. He has never used smokeless tobacco. He reports that he drinks about 1.2 oz of alcohol per week. He reports that he does not use drugs.  Current Outpatient Medications on File Prior to Visit  Medication Sig Dispense Refill  . aspirin 81 MG tablet Take 81 mg by mouth daily.    Marland Kitchen. lisinopril (PRINIVIL,ZESTRIL) 20 MG tablet Take 1 tablet (20 mg total) by mouth daily. 90  tablet 0  . Omega-3 Fatty Acids (FISH OIL) 1000 MG CAPS Take by mouth daily.     No current facility-administered medications on file prior to visit.      Objective:  Objective  Physical Exam  Constitutional: He is oriented to person, place, and time. He appears well-developed and well-nourished. No distress.  HENT:  Head: Normocephalic and atraumatic.  Right Ear: External ear normal.  Left Ear: External ear normal.  Nose: Nose normal.  Mouth/Throat: Oropharynx is clear and moist. No oropharyngeal exudate.  Eyes: Pupils are equal, round, and reactive to light. Conjunctivae and EOM are normal. Right eye exhibits no discharge. Left eye exhibits no discharge.  Neck: Normal range of motion. Neck supple. No JVD present. No thyromegaly present.  Cardiovascular: Normal rate, regular rhythm and intact distal pulses.  No murmur heard. Pulmonary/Chest: Effort normal and breath sounds normal. No respiratory distress. He has no wheezes. He has no rales. He exhibits no tenderness.  Abdominal: Soft. Bowel sounds are normal. He exhibits no distension and no mass. There is no tenderness. There is no rebound and no guarding.  Genitourinary: Prostate normal and penis normal.  Musculoskeletal: Normal range of motion. He exhibits no edema or tenderness.  Lymphadenopathy:    He has no cervical adenopathy.  Neurological: He is alert and oriented to person, place, and time. He has normal reflexes. He displays normal reflexes. No cranial nerve deficit. He exhibits normal muscle tone.  Skin: Skin is warm and dry. No rash noted. He is not diaphoretic. No erythema.  Psychiatric: He has a normal mood and affect. His  behavior is normal. Judgment and thought content normal.   BP 130/80 (BP Location: Right Arm, Cuff Size: Large)   Pulse 81   Temp 98.4 F (36.9 C) (Oral)   Resp 16   Ht 6\' 2"  (1.88 m)   Wt 269 lb 12.8 oz (122.4 kg)   SpO2 96%   BMI 34.64 kg/m  Wt Readings from Last 3 Encounters:  02/07/18  269 lb 12.8 oz (122.4 kg)  01/20/17 263 lb 12.8 oz (119.7 kg)  05/19/16 256 lb (116.1 kg)     Lab Results  Component Value Date   WBC 7.7 01/20/2017   HGB 15.7 01/20/2017   HCT 45.6 01/20/2017   PLT 205.0 01/20/2017   GLUCOSE 93 01/20/2017   CHOL 205 (H) 01/20/2017   TRIG 131.0 01/20/2017   HDL 39.70 01/20/2017   LDLCALC 139 (H) 01/20/2017   ALT 28 01/20/2017   AST 18 01/20/2017   NA 138 01/20/2017   K 4.0 01/20/2017   CL 101 01/20/2017   CREATININE 0.93 01/20/2017   BUN 12 01/20/2017   CO2 29 01/20/2017   TSH 1.70 01/20/2017   PSA 2.59 01/20/2017   INR 1.15 12/07/2010   MICROALBUR 0.2 05/01/2013    Dg Knee Complete 4 Views Left  Result Date: 05/19/2016 CLINICAL DATA:  Chronic medial left knee pain for approximately 8 months, acutely worsened recently. EXAM: LEFT KNEE - COMPLETE 4+ VIEW COMPARISON:  None. FINDINGS: No evidence of acute, subacute or healed fractures. Moderate medial compartment joint space narrowing with associated hypertrophic spurring. Lateral patellofemoral compartment joint spaces well preserved. Bone mineral density well-preserved. No intrinsic osseous abnormality. No visible joint effusion. IMPRESSION: Moderate medial compartment osteoarthritis. Electronically Signed   By: Hulan Saas M.D.   On: 05/19/2016 11:58     Assessment & Plan:  Plan  I am having Joseph [name] start on typhoid. I am also having him maintain his Fish Oil, aspirin, and lisinopril.  Meds ordered this encounter  Medications  . typhoid (VIVOTIF) DR capsule    Sig: Take 1 capsule by mouth every other day.    Dispense:  4 capsule    Refill:  0    Problem List Items Addressed This Visit      Unprioritized   Essential hypertension    Well controlled, no changes to meds. Encouraged heart healthy diet such as the DASH diet and exercise as tolerated.        Relevant Orders   CBC with Differential/Platelet   Comprehensive metabolic panel   Lipid panel   TSH   PSA    Hyperlipidemia    Tolerating statin, encouraged heart healthy diet, avoid trans fats, minimize simple carbs and saturated fats. Increase exercise as tolerated      Preventative health care - Primary    ghm utd Check labs  See AVS Hep a #1 given  shingrix #2 given Typhoid sent to pharmacy Pt going to [place]       Relevant Orders   CBC with Differential/Platelet   Comprehensive metabolic panel   Lipid panel   TSH   PSA    Other Visit Diagnoses    Hyperlipidemia LDL goal <100       Relevant Orders   CBC with Differential/Platelet   Comprehensive metabolic panel   Lipid panel   TSH   PSA   Need for immunization against typhoid       Relevant Medications   typhoid (VIVOTIF) DR capsule   Need for hepatitis A immunization  Relevant Orders   Hepatitis A vaccine adult IM (Completed)   Need for shingles vaccine       Relevant Orders   Varicella-zoster vaccine IM (Shingrix) (Completed)      Follow-up: Return in about 6 months (around 08/09/2018), or if symptoms worsen or fail to improve.  [name], DO

## 2018-02-07 NOTE — Assessment & Plan Note (Signed)
Tolerating statin, encouraged heart healthy diet, avoid trans fats, minimize simple carbs and saturated fats. Increase exercise as tolerated 

## 2018-03-27 IMAGING — CR DG KNEE COMPLETE 4+V*L*
4 series · 4 of 4 positions shown · non-contrast
Comparison: None.

CLINICAL DATA: Chronic medial left knee pain for approximately 8
months, acutely worsened recently.

EXAM:
LEFT KNEE - COMPLETE 4+ VIEW

[t knee ap left]
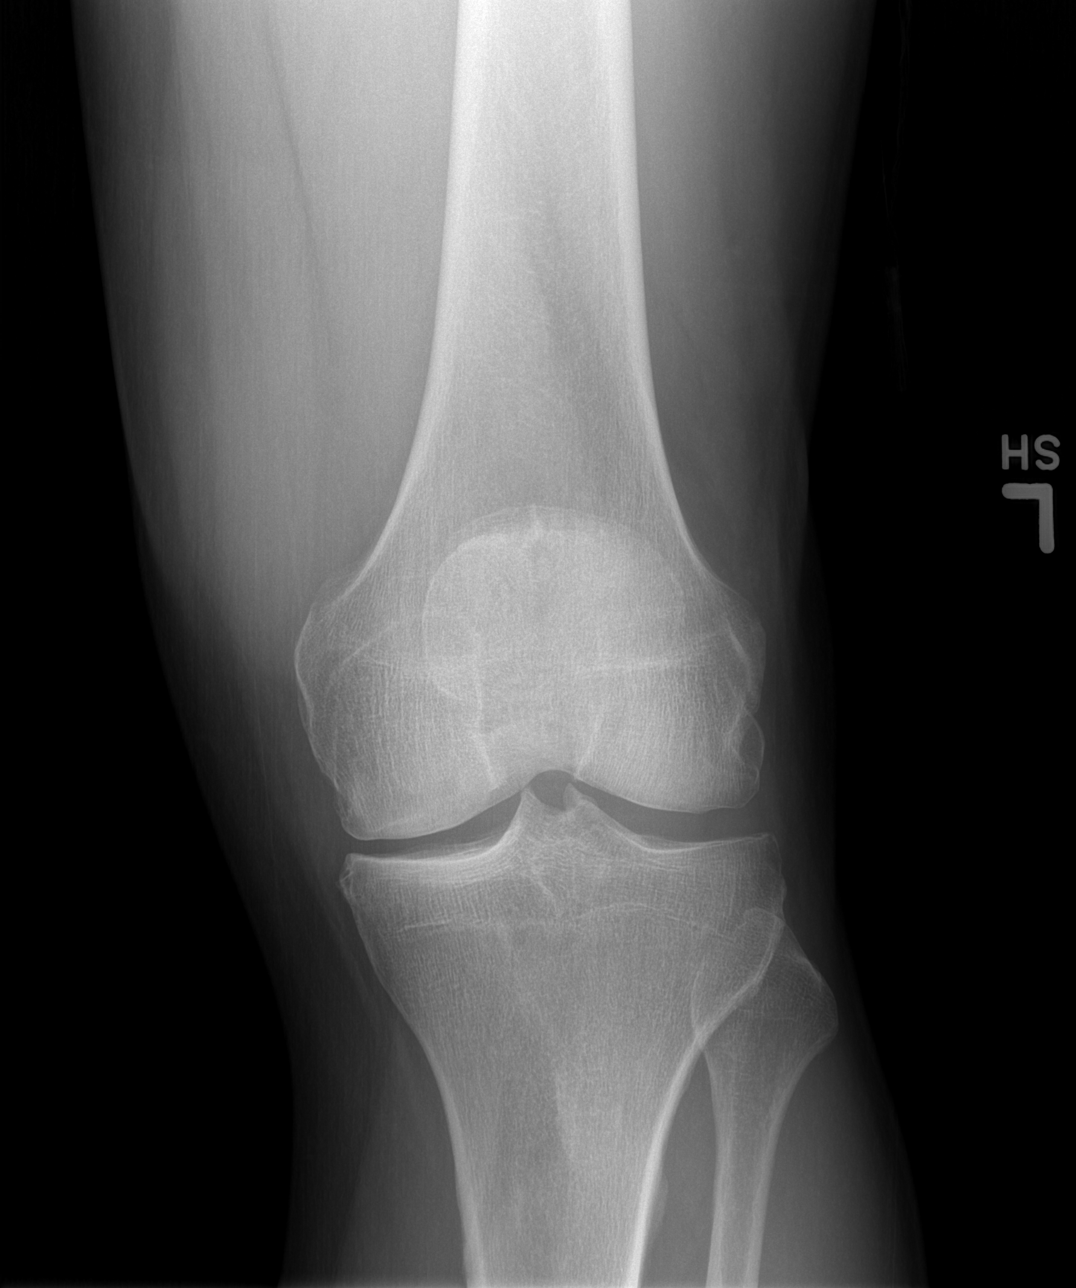

[t knee oblique left (1 of 2)]
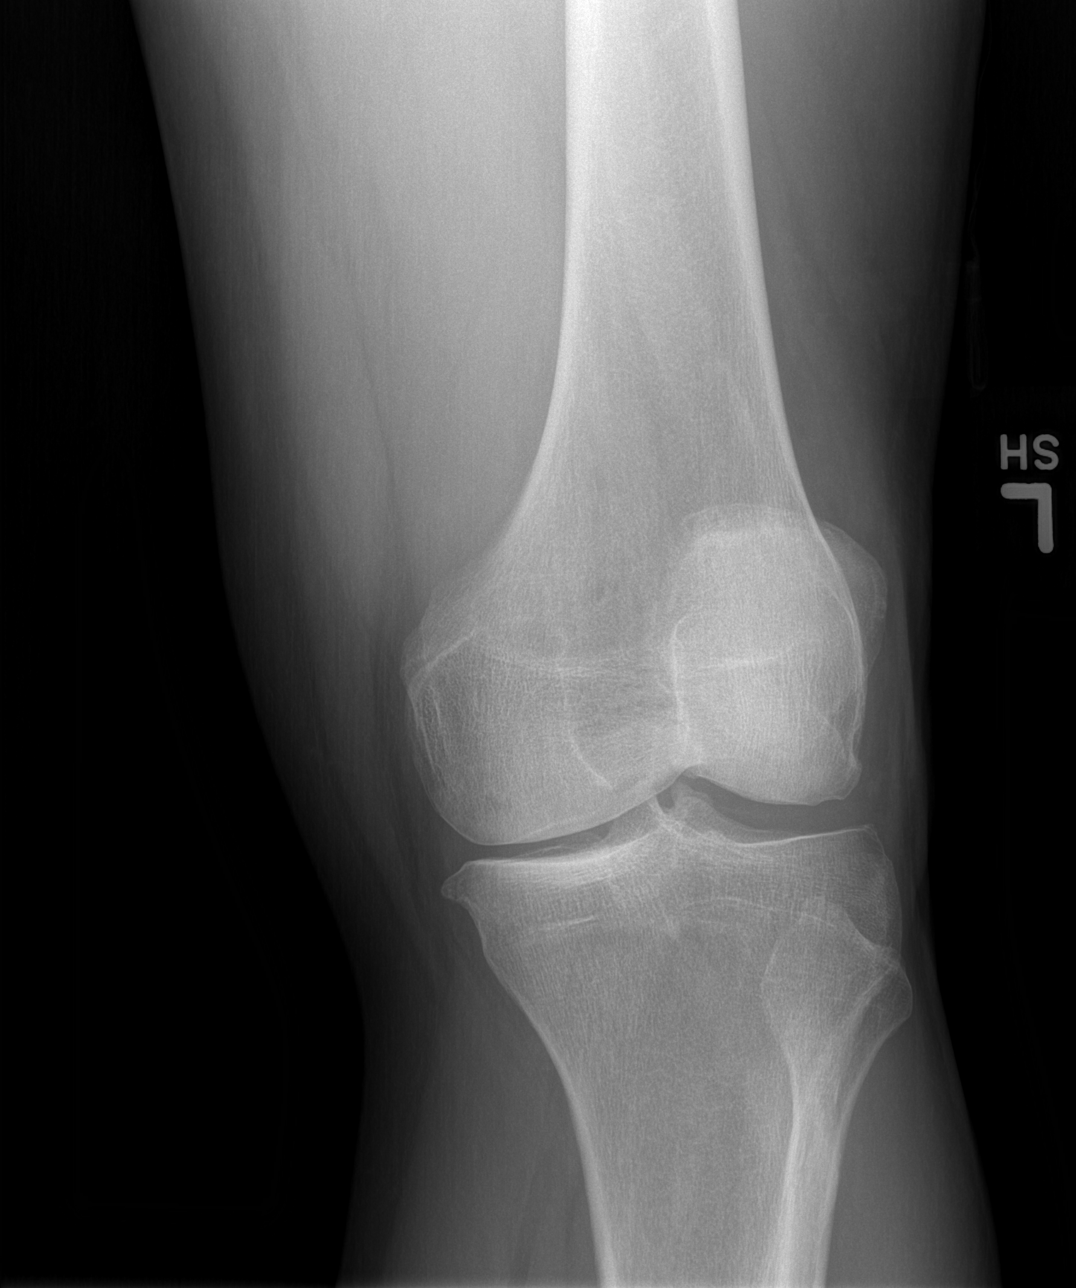

[t knee oblique left (2 of 2)]
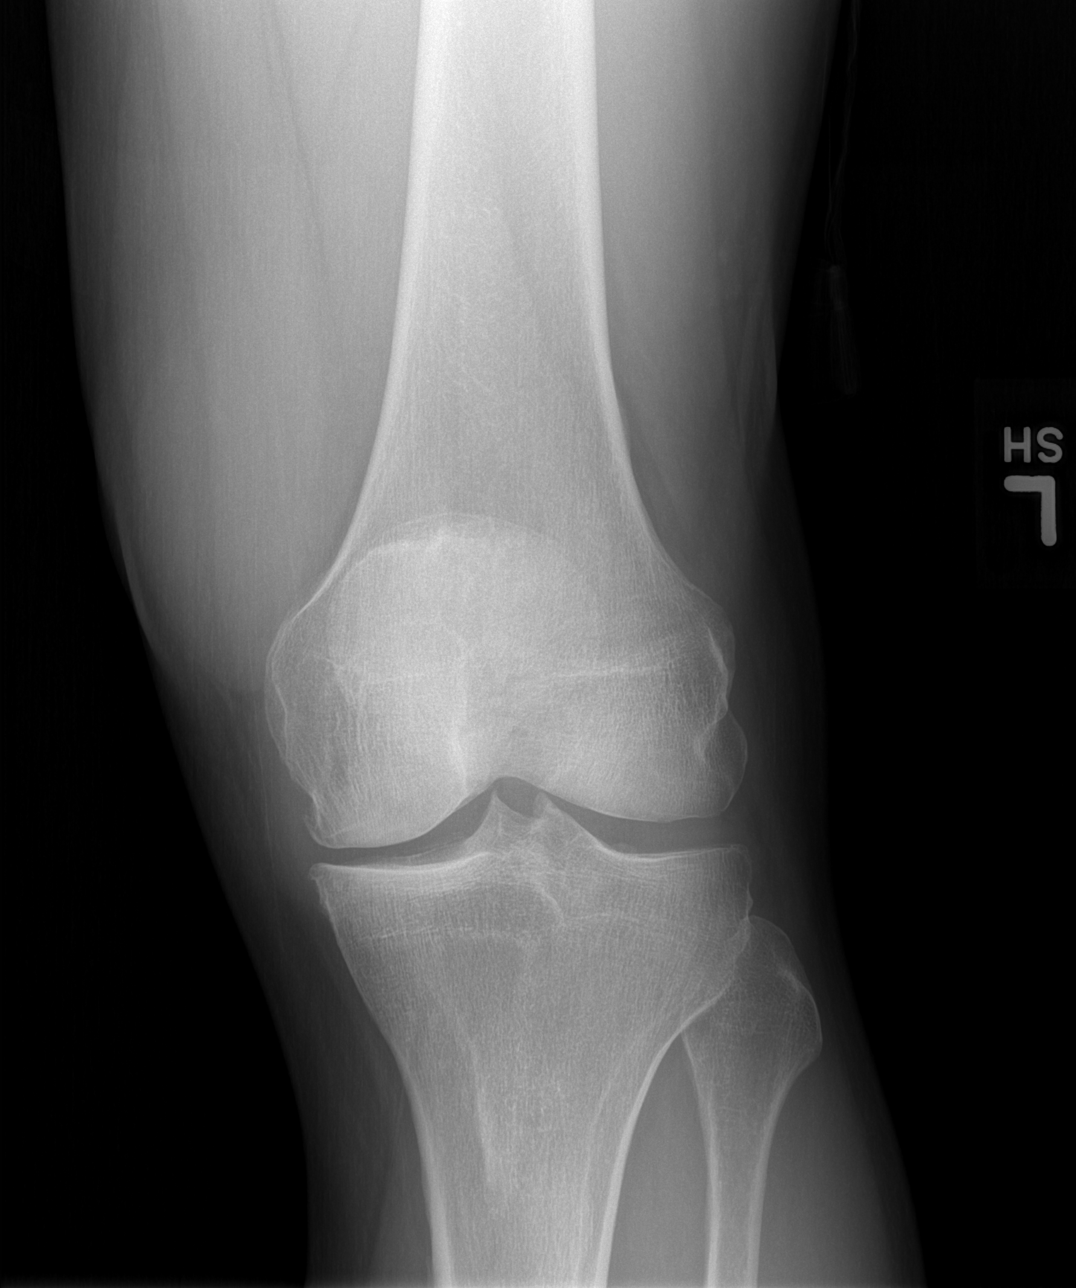

[t knee lat left]
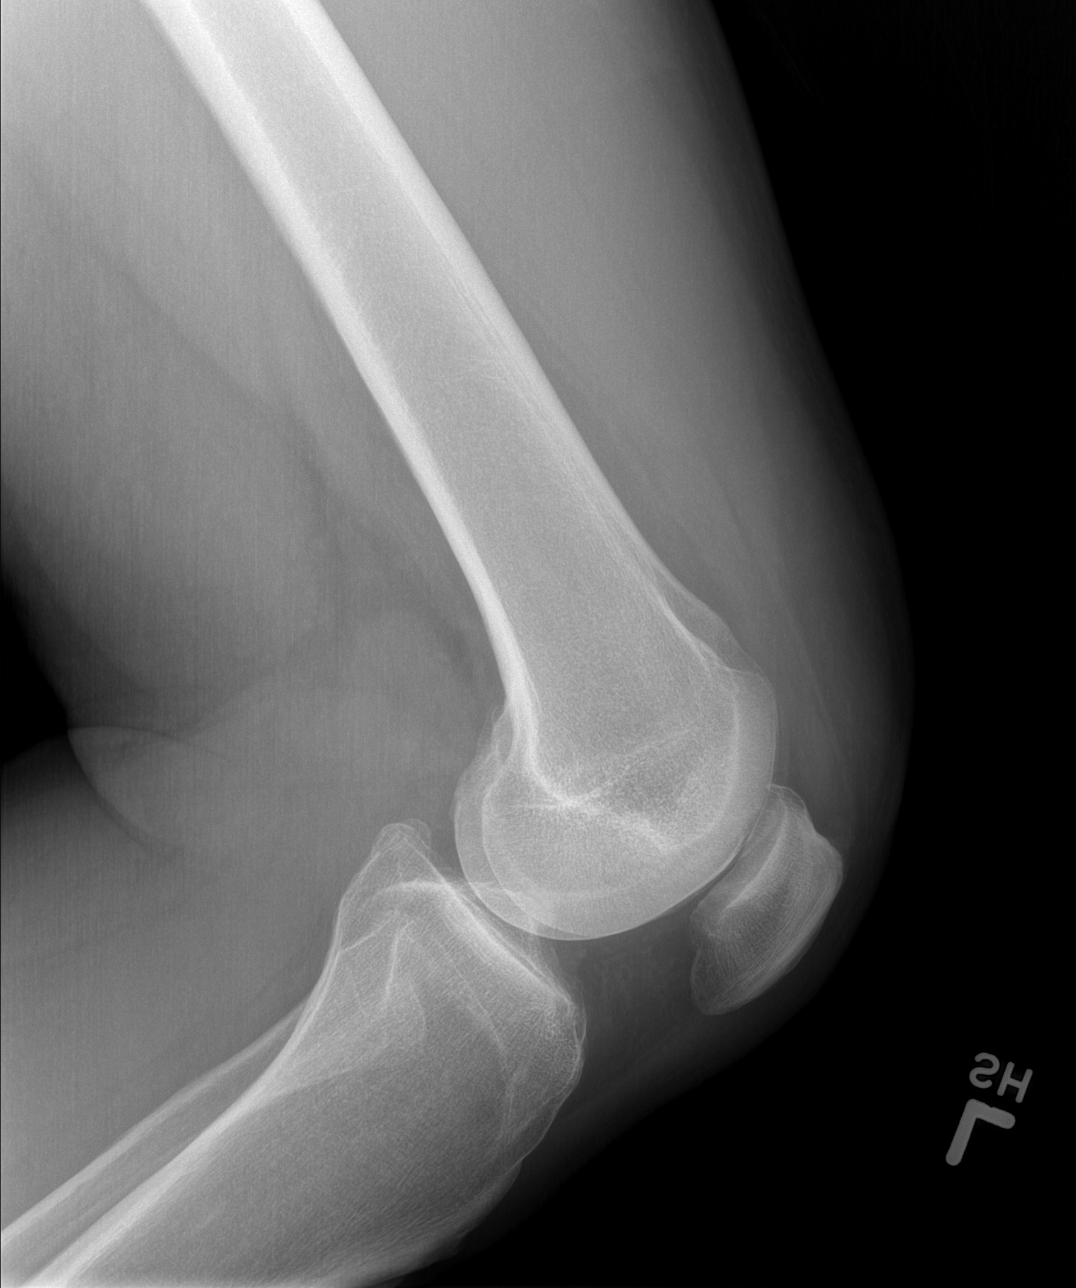

[4 of 4 positions shown; findings below may reference images not displayed]

FINDINGS: No evidence of acute, subacute or healed fractures. Moderate medial
compartment joint space narrowing with associated hypertrophic
spurring. Lateral patellofemoral compartment joint spaces well
preserved. Bone mineral density well-preserved. No intrinsic osseous
abnormality. No visible joint effusion.
IMPRESSION: Moderate medial compartment osteoarthritis.

## 2018-04-14 ENCOUNTER — Telehealth: Payer: Self-pay | Admitting: *Deleted

## 2018-04-14 NOTE — Telephone Encounter (Signed)
[facility] forwarded to provider/SLS 06/28

## 2018-07-16 ENCOUNTER — Other Ambulatory Visit: Payer: Self-pay | Admitting: Family Medicine

## 2018-08-08 ENCOUNTER — Encounter: Payer: Self-pay | Admitting: Family Medicine

## 2018-08-08 ENCOUNTER — Ambulatory Visit: Payer: BLUE CROSS/BLUE SHIELD | Admitting: Family Medicine

## 2018-08-08 VITALS — BP 126/72 | HR 72 | Temp 98.1°F | Resp 16 | Ht 74.0 in | Wt 225.6 lb

## 2018-08-08 DIAGNOSIS — I10 Essential (primary) hypertension: Secondary | ICD-10-CM | POA: Diagnosis not present

## 2018-08-08 DIAGNOSIS — E785 Hyperlipidemia, unspecified: Secondary | ICD-10-CM

## 2018-08-08 DIAGNOSIS — N529 Male erectile dysfunction, unspecified: Secondary | ICD-10-CM | POA: Diagnosis not present

## 2018-08-08 DIAGNOSIS — M25512 Pain in left shoulder: Secondary | ICD-10-CM

## 2018-08-08 DIAGNOSIS — Z23 Encounter for immunization: Secondary | ICD-10-CM

## 2018-08-08 DIAGNOSIS — G8929 Other chronic pain: Secondary | ICD-10-CM

## 2018-08-08 MED ORDER — SILDENAFIL CITRATE 100 MG PO TABS: 50.0000 mg | ORAL_TABLET | Freq: Every day | ORAL | 11 refills | Status: DC | PRN

## 2018-08-08 NOTE — Addendum Note (Signed)
Addended by: Thelma Barge D on: 08/08/2018 06:51 PM   Modules accepted: Orders

## 2018-08-08 NOTE — Patient Instructions (Signed)

## 2018-08-08 NOTE — Progress Notes (Signed)
Patient ID: Joseph [name], male    DOB: [birth year]  Age: 62 y.o. MRN: [id number]    Subjective:  Subjective  HPI Joseph [name] presents for f/u bp and chol   He is also requesting a flu shot  Pt also c/o ED and is requesting a testosterone level and medication.    Review of Systems  Constitutional: Negative for appetite change, diaphoresis, fatigue and unexpected weight change.  Eyes: Negative for pain, redness and visual disturbance.  Respiratory: Negative for cough, chest tightness, shortness of breath and wheezing.   Cardiovascular: Negative for chest pain, palpitations and leg swelling.  Endocrine: Negative for cold intolerance, heat intolerance, polydipsia, polyphagia and polyuria.  Genitourinary: Negative for difficulty urinating, dysuria and frequency.  Neurological: Negative for dizziness, light-headedness, numbness and headaches.    History Past Medical History:  Diagnosis Date  . Hyperlipemia   . Hypertension     He has a past surgical history that includes Exploratory laparotomy and Refractive surgery.   His family history includes Allergic Disorder in his sister; Alzheimer's disease in his mother; Arthritis in his father and mother; Cancer (age of onset: 46) in his mother; Colon cancer in his unknown relative; Healthy in his brother; Pancreatic cancer (age of onset: 46) in his father.He reports that he has never smoked. He has never used smokeless tobacco. He reports that he drinks about 2.0 standard drinks of alcohol per week. He reports that he does not use drugs.  Current Outpatient Medications on File Prior to Visit  Medication Sig Dispense Refill  . aspirin 81 MG tablet Take 81 mg by mouth daily.    Marland Kitchen lisinopril (PRINIVIL,ZESTRIL) 20 MG tablet TAKE 1 TABLET BY MOUTH EVERY DAY 90 tablet 1  . Omega-3 Fatty Acids (FISH OIL) 1000 MG CAPS Take by mouth daily.     No current facility-administered medications on file prior to visit.      Objective:  Objective  Physical  Exam  Constitutional: He is oriented to person, place, and time. Vital signs are normal. He appears well-developed and well-nourished. He is sleeping.  HENT:  Head: Normocephalic and atraumatic.  Mouth/Throat: Oropharynx is clear and moist.  Eyes: Pupils are equal, round, and reactive to light. EOM are normal.  Neck: Normal range of motion. Neck supple. No thyromegaly present.  Cardiovascular: Normal rate and regular rhythm.  No murmur heard. Pulmonary/Chest: Effort normal and breath sounds normal. No respiratory distress. He has no wheezes. He has no rales. He exhibits no tenderness.  Musculoskeletal: He exhibits no edema or tenderness.  Neurological: He is alert and oriented to person, place, and time.  Skin: Skin is warm and dry.  Psychiatric: He has a normal mood and affect. His behavior is normal. Judgment and thought content normal.  Nursing note and vitals reviewed.  BP 126/72 (BP Location: Left Arm, Cuff Size: Large)   Pulse 72   Temp 98.1 F (36.7 C) (Oral)   Resp 16   Ht 6\' 2"  (1.88 m)   Wt 225 lb 9.6 oz (102.3 kg)   SpO2 97%   BMI 28.97 kg/m  Wt Readings from Last 3 Encounters:  08/08/18 225 lb 9.6 oz (102.3 kg)  02/07/18 269 lb 12.8 oz (122.4 kg)  01/20/17 263 lb 12.8 oz (119.7 kg)     Lab Results  Component Value Date   WBC 7.4 02/07/2018   HGB 15.5 02/07/2018   HCT 44.9 02/07/2018   PLT 202.0 02/07/2018   GLUCOSE 95 02/07/2018   CHOL 179 02/07/2018  TRIG 109.0 02/07/2018   HDL 40.20 02/07/2018   LDLCALC 117 (H) 02/07/2018   ALT 27 02/07/2018   AST 21 02/07/2018   NA 138 02/07/2018   K 4.1 02/07/2018   CL 102 02/07/2018   CREATININE 0.78 02/07/2018   BUN 10 02/07/2018   CO2 31 02/07/2018   TSH 1.80 02/07/2018   PSA 2.92 02/07/2018   INR 1.15 12/07/2010   MICROALBUR 0.2 05/01/2013    Dg Knee Complete 4 Views Left  Result Date: 05/19/2016 CLINICAL DATA:  Chronic medial left knee pain for approximately 8 months, acutely worsened recently. EXAM:  LEFT KNEE - COMPLETE 4+ VIEW COMPARISON:  None. FINDINGS: No evidence of acute, subacute or healed fractures. Moderate medial compartment joint space narrowing with associated hypertrophic spurring. Lateral patellofemoral compartment joint spaces well preserved. Bone mineral density well-preserved. No intrinsic osseous abnormality. No visible joint effusion. IMPRESSION: Moderate medial compartment osteoarthritis. Electronically Signed   By: Hulan Saas M.D.   On: 05/19/2016 11:58     Assessment & Plan:  Plan  I have discontinued [name]'s typhoid. I am also having him start on sildenafil. Additionally, I am having him maintain his Fish Oil, aspirin, and lisinopril.  Meds ordered this encounter  Medications  . sildenafil (VIAGRA) 100 MG tablet    Sig: Take 0.5-1 tablets (50-100 mg total) by mouth daily as needed for erectile dysfunction.    Dispense:  5 tablet    Refill:  11    Problem List Items Addressed This Visit      Unprioritized   Chronic left shoulder pain   Relevant Orders   Ambulatory referral to Sports Medicine   Erectile dysfunction    Check testosterone  viagra rx written       Relevant Medications   sildenafil (VIAGRA) 100 MG tablet   Other Relevant Orders   Testos,Total,Free and SHBG (Male)   Essential hypertension    Well controlled, no changes to meds. Encouraged heart healthy diet such as the DASH diet and exercise as tolerated.       Relevant Medications   sildenafil (VIAGRA) 100 MG tablet   Hyperlipidemia - Primary    On no meds Lab Results  Component Value Date   CHOL 179 02/07/2018   HDL 40.20 02/07/2018   LDLCALC 117 (H) 02/07/2018   TRIG 109.0 02/07/2018   CHOLHDL 4 02/07/2018        Relevant Medications   sildenafil (VIAGRA) 100 MG tablet   Other Relevant Orders   Comprehensive metabolic panel   Lipid panel     Flu shot given Follow-up: Return if symptoms worsen or fail to improve.  Joseph Schultz, DO

## 2018-08-08 NOTE — Assessment & Plan Note (Signed)
On no meds Lab Results  Component Value Date   CHOL 179 02/07/2018   HDL 40.20 02/07/2018   LDLCALC 117 (H) 02/07/2018   TRIG 109.0 02/07/2018   CHOLHDL 4 02/07/2018

## 2018-08-08 NOTE — Assessment & Plan Note (Signed)
Check testosterone  viagra rx written

## 2018-08-08 NOTE — Assessment & Plan Note (Signed)
Well controlled, no changes to meds. Encouraged heart healthy diet such as the DASH diet and exercise as tolerated.  °

## 2018-08-09 ENCOUNTER — Other Ambulatory Visit (INDEPENDENT_AMBULATORY_CARE_PROVIDER_SITE_OTHER): Payer: BLUE CROSS/BLUE SHIELD

## 2018-08-09 DIAGNOSIS — N529 Male erectile dysfunction, unspecified: Secondary | ICD-10-CM | POA: Diagnosis not present

## 2018-08-09 DIAGNOSIS — E785 Hyperlipidemia, unspecified: Secondary | ICD-10-CM

## 2018-08-09 LAB — COMPREHENSIVE METABOLIC PANEL
ALBUMIN: 4.8 g/dL (ref 3.5–5.2)
ALK PHOS: 73 U/L (ref 39–117)
ALT: 24 U/L (ref 0–53)
AST: 17 U/L (ref 0–37)
BILIRUBIN TOTAL: 0.6 mg/dL (ref 0.2–1.2)
BUN: 17 mg/dL (ref 6–23)
CO2: 30 mEq/L (ref 19–32)
Calcium: 9.9 mg/dL (ref 8.4–10.5)
Chloride: 102 mEq/L (ref 96–112)
Creatinine, Ser: 0.91 mg/dL (ref 0.40–1.50)
GFR: 90.33 mL/min (ref >60.00)
GLUCOSE: 101 mg/dL — AB (ref 70–99)
POTASSIUM: 4.3 meq/L (ref 3.5–5.1)
Sodium: 138 mEq/L (ref 135–145)
TOTAL PROTEIN: 7.5 g/dL (ref 6.0–8.3)

## 2018-08-09 LAB — LIPID PANEL
CHOLESTEROL: 188 mg/dL (ref 0–200)
HDL: 44.7 mg/dL (ref >39.00)
LDL Cholesterol: 127 mg/dL — ABNORMAL HIGH (ref 0–99)
NONHDL: 143.57
Total CHOL/HDL Ratio: 4
Triglycerides: 84 mg/dL (ref 0.0–149.0)
VLDL: 16.8 mg/dL (ref 0.0–40.0)

## 2018-08-13 LAB — TESTOS,TOTAL,FREE AND SHBG (FEMALE)
FREE TESTOSTERONE: 73.2 pg/mL (ref 35.0–155.0)
Sex Hormone Binding: 34 nmol/L (ref 22–77)
TESTOSTERONE, TOTAL, LC-MS-MS: 505 ng/dL (ref 250–1100)

## 2018-08-14 ENCOUNTER — Encounter: Payer: Self-pay | Admitting: Family Medicine

## 2018-08-14 ENCOUNTER — Ambulatory Visit (INDEPENDENT_AMBULATORY_CARE_PROVIDER_SITE_OTHER): Payer: BLUE CROSS/BLUE SHIELD | Admitting: Family Medicine

## 2018-08-14 ENCOUNTER — Other Ambulatory Visit: Payer: Self-pay | Admitting: Family Medicine

## 2018-08-14 VITALS — BP 128/81 | HR 76 | Ht 74.0 in | Wt 257.0 lb

## 2018-08-14 DIAGNOSIS — E785 Hyperlipidemia, unspecified: Secondary | ICD-10-CM

## 2018-08-14 DIAGNOSIS — M7542 Impingement syndrome of left shoulder: Secondary | ICD-10-CM

## 2018-08-14 MED ORDER — ROSUVASTATIN CALCIUM 10 MG PO TABS: 10.0000 mg | ORAL_TABLET | Freq: Every day | ORAL | 3 refills | Status: DC

## 2018-08-14 NOTE — Progress Notes (Signed)
PCP and consultation requested by: Seabron Spates R, DO  Subjective:   HPI: Patient is a 62 y.o. male here for left shoulder pain for about 6 months.  Currently patient reports 0/10 pain at rest.  Some mild pain laterally particularly while at the gym performing overhead movements.  He also has pain in left shoulder while laying on that side.  He denies any specific injury to his shoulder any time in the past.  He has not taking any medications or tried any other interventions.  He denies any noticeable weakness.  No numbness or tingling.  No associated skin changes  Past Medical History:  Diagnosis Date  . Hyperlipemia   . Hypertension     Current Outpatient Medications on File Prior to Visit  Medication Sig Dispense Refill  . aspirin 81 MG tablet Take 81 mg by mouth daily.    Marland Kitchen lisinopril (PRINIVIL,ZESTRIL) 20 MG tablet TAKE 1 TABLET BY MOUTH EVERY DAY 90 tablet 1  . Omega-3 Fatty Acids (FISH OIL) 1000 MG CAPS Take by mouth daily.    . sildenafil (VIAGRA) 100 MG tablet Take 0.5-1 tablets (50-100 mg total) by mouth daily as needed for erectile dysfunction. 5 tablet 11   No current facility-administered medications on file prior to visit.     Past Surgical History:  Procedure Laterality Date  . EXPLORATORY LAPAROTOMY     for lacerated liver in high school  . REFRACTIVE SURGERY      No Known Allergies  Social History   Socioeconomic History  . Marital status: Married    Spouse name: Not on file  . Number of children: 4  . Years of education: Not on file  . Highest education level: Not on file  Occupational History  . Not on file  Social Needs  . Financial resource strain: Not on file  . Food insecurity:    Worry: Not on file    Inability: Not on file  . Transportation needs:    Medical: Not on file    Non-medical: Not on file  Tobacco Use  . Smoking status: Never Smoker  . Smokeless tobacco: Never Used  Substance and Sexual Activity  . Alcohol use: Yes     Alcohol/week: 2.0 standard drinks    Types: 2 Glasses of wine per week    Comment: daily - 2-3 bottles of wine per week  . Drug use: No  . Sexual activity: Yes    Partners: Female  Lifestyle  . Physical activity:    Days per week: Not on file    Minutes per session: Not on file  . Stress: Not on file  Relationships  . Social connections:    Talks on phone: Not on file    Gets together: Not on file    Attends religious service: Not on file    Active member of club or organization: Not on file    Attends meetings of clubs or organizations: Not on file    Relationship status: Not on file  . Intimate partner violence:    Fear of current or ex partner: Not on file    Emotionally abused: Not on file    Physically abused: Not on file    Forced sexual activity: Not on file  Other Topics Concern  . Not on file  Social History Narrative   Exercise -no   Lives with wife in a two story home.  Has 4 children.     Works as an Art gallery manager.  Education: college    Family History  Problem Relation Age of Onset  . Pancreatic cancer Father 71       Deceased  . Arthritis Father   . Arthritis Mother   . Alzheimer's disease Mother        Living  . Cancer Mother 54       colon  . Colon cancer Unknown   . Allergic Disorder Sister   . Healthy Brother     BP 128/81   Pulse 76   Ht 6\' 2"  (1.88 m)   Wt 257 lb (116.6 kg)   BMI 33.00 kg/m   Review of Systems: See HPI above.     Objective:  Physical Exam:  Gen: awake, alert, NAD, comfortable in exam room Pulm: breathing unlabored  Left shoulder: No obvious deformity or asymmetry. No bruising. No swelling No TTP. Specifically no TTP over [facility] joint Full ROM in flexion, abduction, internal/external rotation. Pain reproduced with abduction NV intact distally Special Tests:  - Impingement: mild positive Hawkins and Neers.  - Supraspinatous: Negative empty can.  5/5 strength - Infraspinatous/Teres: 5/5 strength with ER -  Subscapularis: negative belly press, negative bear hug. 5/5 strength with IR - Biceps tendon: Negative Speeds.  - Labrum: Negative Obriens.  - AC Joint: Negative cross arm - Negative apprehension test  Right shoulder: No deformity No AC joint or biceps tendon TTP Full ROM 5/5 strength NV Intact    Assessment & Plan:  1. Subacromial impingement, left shoulder -patient has symptoms of mild subacromial impingement.  Recommend Aleve 2 tablets twice daily for 1 week.  We will start him on home exercise program for rotator cuff strengthening scapular stabilization.  He will follow-up in 6 weeks.

## 2018-08-14 NOTE — Patient Instructions (Signed)
You have rotator cuff impingement Try to avoid painful activities (overhead activities, lifting with extended arm) as much as possible. Aleve 1-2 tabs twice a day with food as needed for pain and inflammation. Can take tylenol in addition to this. Subacromial injection may be beneficial to help with pain and to decrease inflammation. Consider physical therapy with transition to home exercise program. Do home exercise program with theraband and scapular stabilization exercises daily 3 sets of 10 once a day. If not improving at follow-up we will consider imaging, injection, physical therapy, and/or nitro patches. Follow up with me in 6 weeks.

## 2018-09-25 ENCOUNTER — Ambulatory Visit: Payer: BLUE CROSS/BLUE SHIELD | Admitting: Family Medicine

## 2018-10-02 DIAGNOSIS — I10 Essential (primary) hypertension: Secondary | ICD-10-CM | POA: Diagnosis not present

## 2018-10-02 DIAGNOSIS — Z23 Encounter for immunization: Secondary | ICD-10-CM | POA: Diagnosis not present

## 2018-10-02 DIAGNOSIS — I4510 Unspecified right bundle-branch block: Secondary | ICD-10-CM | POA: Diagnosis not present

## 2018-10-02 DIAGNOSIS — E785 Hyperlipidemia, unspecified: Secondary | ICD-10-CM | POA: Diagnosis not present

## 2018-12-12 DIAGNOSIS — M170 Bilateral primary osteoarthritis of knee: Secondary | ICD-10-CM | POA: Diagnosis not present

## 2019-01-19 ENCOUNTER — Other Ambulatory Visit: Payer: Self-pay | Admitting: *Deleted

## 2019-01-19 MED ORDER — LISINOPRIL 20 MG PO TABS: 20.0000 mg | ORAL_TABLET | Freq: Every day | ORAL | 1 refills | Status: DC

## 2019-03-09 DIAGNOSIS — J019 Acute sinusitis, unspecified: Secondary | ICD-10-CM | POA: Diagnosis not present

## 2019-03-09 DIAGNOSIS — H9202 Otalgia, left ear: Secondary | ICD-10-CM | POA: Diagnosis not present

## 2019-03-15 ENCOUNTER — Telehealth: Payer: Self-pay | Admitting: *Deleted

## 2019-03-15 NOTE — Telephone Encounter (Signed)
Patient called to see if he can come into the office because of ear infection.  He was seen by urgent care and wanted to be seen by Dr. Laury Axon.  Dr.Lowne is working remotely.  While Gwen was about to explain that he can come in and see another provider patient hung up.  I called patient back to see if he would like to come in to see another provider but I had to leave a message.  On message I advised that he can call back to come in and see another provider today if he likes.

## 2019-04-23 DIAGNOSIS — R600 Localized edema: Secondary | ICD-10-CM | POA: Diagnosis not present

## 2019-04-23 DIAGNOSIS — S82454A Nondisplaced comminuted fracture of shaft of right fibula, initial encounter for closed fracture: Secondary | ICD-10-CM | POA: Diagnosis not present

## 2019-04-23 DIAGNOSIS — S81802A Unspecified open wound, left lower leg, initial encounter: Secondary | ICD-10-CM | POA: Diagnosis not present

## 2019-04-23 DIAGNOSIS — S92001A Unspecified fracture of right calcaneus, initial encounter for closed fracture: Secondary | ICD-10-CM | POA: Diagnosis not present

## 2019-04-23 DIAGNOSIS — S92002A Unspecified fracture of left calcaneus, initial encounter for closed fracture: Secondary | ICD-10-CM | POA: Diagnosis not present

## 2019-04-23 DIAGNOSIS — W11XXXA Fall on and from ladder, initial encounter: Secondary | ICD-10-CM | POA: Diagnosis not present

## 2019-04-23 DIAGNOSIS — Z23 Encounter for immunization: Secondary | ICD-10-CM | POA: Diagnosis not present

## 2019-04-23 DIAGNOSIS — L03116 Cellulitis of left lower limb: Secondary | ICD-10-CM | POA: Diagnosis not present

## 2019-04-23 DIAGNOSIS — M25572 Pain in left ankle and joints of left foot: Secondary | ICD-10-CM | POA: Diagnosis not present

## 2019-04-23 DIAGNOSIS — S8992XA Unspecified injury of left lower leg, initial encounter: Secondary | ICD-10-CM | POA: Diagnosis not present

## 2019-04-23 DIAGNOSIS — S82401A Unspecified fracture of shaft of right fibula, initial encounter for closed fracture: Secondary | ICD-10-CM | POA: Diagnosis not present

## 2019-07-14 ENCOUNTER — Other Ambulatory Visit: Payer: Self-pay | Admitting: Family Medicine

## 2019-10-03 ENCOUNTER — Encounter: Payer: Self-pay | Admitting: Gastroenterology

## 2019-10-23 ENCOUNTER — Other Ambulatory Visit: Payer: Self-pay | Admitting: Family Medicine

## 2019-11-01 ENCOUNTER — Other Ambulatory Visit: Payer: BLUE CROSS/BLUE SHIELD

## 2019-11-14 ENCOUNTER — Other Ambulatory Visit: Payer: Self-pay | Admitting: Family Medicine

## 2019-11-14 DIAGNOSIS — N529 Male erectile dysfunction, unspecified: Secondary | ICD-10-CM

## 2019-11-17 ENCOUNTER — Other Ambulatory Visit: Payer: Self-pay | Admitting: Family Medicine

## 2019-11-20 ENCOUNTER — Other Ambulatory Visit: Payer: Self-pay | Admitting: Family Medicine

## 2019-11-21 ENCOUNTER — Other Ambulatory Visit: Payer: Self-pay

## 2019-11-22 ENCOUNTER — Encounter: Payer: Self-pay | Admitting: Family Medicine

## 2019-11-22 ENCOUNTER — Ambulatory Visit (INDEPENDENT_AMBULATORY_CARE_PROVIDER_SITE_OTHER): Payer: 59 | Admitting: Family Medicine

## 2019-11-22 VITALS — BP 118/84 | HR 76 | Temp 97.2°F | Resp 18 | Ht 74.0 in | Wt 244.0 lb

## 2019-11-22 DIAGNOSIS — Z23 Encounter for immunization: Secondary | ICD-10-CM | POA: Diagnosis not present

## 2019-11-22 DIAGNOSIS — Z0000 Encounter for general adult medical examination without abnormal findings: Secondary | ICD-10-CM | POA: Diagnosis not present

## 2019-11-22 DIAGNOSIS — I10 Essential (primary) hypertension: Secondary | ICD-10-CM

## 2019-11-22 DIAGNOSIS — E785 Hyperlipidemia, unspecified: Secondary | ICD-10-CM

## 2019-11-22 LAB — COMPREHENSIVE METABOLIC PANEL
ALT: 27 U/L (ref 0–53)
AST: 21 U/L (ref 0–37)
Albumin: 4.8 g/dL (ref 3.5–5.2)
Alkaline Phosphatase: 64 U/L (ref 39–117)
BUN: 15 mg/dL (ref 6–23)
CO2: 30 mEq/L (ref 19–32)
Calcium: 9.9 mg/dL (ref 8.4–10.5)
Chloride: 101 mEq/L (ref 96–112)
Creatinine, Ser: 0.77 mg/dL (ref 0.40–1.50)
GFR: 102.61 mL/min (ref >60.00)
Glucose, Bld: 95 mg/dL (ref 70–99)
Potassium: 4 mEq/L (ref 3.5–5.1)
Sodium: 138 mEq/L (ref 135–145)
Total Bilirubin: 0.5 mg/dL (ref 0.2–1.2)
Total Protein: 7.5 g/dL (ref 6.0–8.3)

## 2019-11-22 LAB — CBC WITH DIFFERENTIAL/PLATELET
Basophils Absolute: 0 10*3/uL (ref 0.0–0.1)
Basophils Relative: 0.4 % (ref 0.0–3.0)
Eosinophils Absolute: 0.2 10*3/uL (ref 0.0–0.7)
Eosinophils Relative: 2.7 % (ref 0.0–5.0)
HCT: 46.3 % (ref 39.0–52.0)
Hemoglobin: 15.5 g/dL (ref 13.0–17.0)
Lymphocytes Relative: 23.3 % (ref 12.0–46.0)
Lymphs Abs: 1.5 10*3/uL (ref 0.7–4.0)
MCHC: 33.6 g/dL (ref 30.0–36.0)
MCV: 99.2 fl (ref 78.0–100.0)
Monocytes Absolute: 0.5 10*3/uL (ref 0.1–1.0)
Monocytes Relative: 8 % (ref 3.0–12.0)
Neutro Abs: 4.2 10*3/uL (ref 1.4–7.7)
Neutrophils Relative %: 65.6 % (ref 43.0–77.0)
Platelets: 182 10*3/uL (ref 150.0–400.0)
RBC: 4.66 Mil/uL (ref 4.22–5.81)
RDW: 12.6 % (ref 11.5–15.5)
WBC: 6.4 10*3/uL (ref 4.0–10.5)

## 2019-11-22 LAB — LIPID PANEL
Cholesterol: 133 mg/dL (ref 0–200)
HDL: 42.5 mg/dL (ref >39.00)
LDL Cholesterol: 78 mg/dL (ref 0–99)
NonHDL: 90.97
Total CHOL/HDL Ratio: 3
Triglycerides: 67 mg/dL (ref 0.0–149.0)
VLDL: 13.4 mg/dL (ref 0.0–40.0)

## 2019-11-22 LAB — PSA: PSA: 3.14 ng/mL (ref 0.10–4.00)

## 2019-11-22 LAB — TSH: TSH: 1.9 u[IU]/mL (ref 0.35–4.50)

## 2019-11-22 MED ORDER — LISINOPRIL 20 MG PO TABS: 20.0000 mg | ORAL_TABLET | Freq: Every day | ORAL | 3 refills | Status: DC

## 2019-11-22 NOTE — Patient Instructions (Signed)
COVID-19 Vaccine Information can be found at: [email or web address] For questions related to vaccine distribution or appointments, please email [email or web address] or call [phone].         Preventive Care 40-62 Years Old, Male Preventive care refers to lifestyle choices and visits with your health care provider that can promote health and wellness. This includes:  A yearly physical exam. This is also called an annual well check.  Regular dental and eye exams.  Immunizations.  Screening for certain conditions.  Healthy lifestyle choices, such as eating a healthy diet, getting regular exercise, not using drugs or products that contain nicotine and tobacco, and limiting alcohol use. What can I expect for my preventive care visit? Physical exam Your health care provider will check:  Height and weight. These may be used to calculate body mass index (BMI), which is a measurement that tells if you are at a healthy weight.  Heart rate and blood pressure.  Your skin for abnormal spots. Counseling Your health care provider may ask you questions about:  Alcohol, tobacco, and drug use.  Emotional well-being.  Home and relationship well-being.  Sexual activity.  Eating habits.  Work and work environment. What immunizations do I need?  Influenza (flu) vaccine  This is recommended every year. Tetanus, diphtheria, and pertussis (Tdap) vaccine  You may need a Td booster every 10 years. Varicella (chickenpox) vaccine  You may need this vaccine if you have not already been vaccinated. Zoster (shingles) vaccine  You may need this after age 60. Measles, mumps, and rubella (MMR) vaccine  You may need at least one dose of MMR if you were born in 1957 or later. You may also need a second dose. Pneumococcal conjugate (PCV13) vaccine  You may need this if you have certain conditions and were not previously  vaccinated. Pneumococcal polysaccharide (PPSV23) vaccine  You may need one or two doses if you smoke cigarettes or if you have certain conditions. Meningococcal conjugate (MenACWY) vaccine  You may need this if you have certain conditions. Hepatitis A vaccine  You may need this if you have certain conditions or if you travel or work in places where you may be exposed to hepatitis A. Hepatitis B vaccine  You may need this if you have certain conditions or if you travel or work in places where you may be exposed to hepatitis B. Haemophilus influenzae type b (Hib) vaccine  You may need this if you have certain risk factors. Human papillomavirus (HPV) vaccine  If recommended by your health care provider, you may need three doses over 6 months. You may receive vaccines as individual doses or as more than one vaccine together in one shot (combination vaccines). Talk with your health care provider about the risks and benefits of combination vaccines. What tests do I need? Blood tests  Lipid and cholesterol levels. These may be checked every 5 years, or more frequently if you are over 50 years old.  Hepatitis C test.  Hepatitis B test. Screening  Lung cancer screening. You may have this screening every year starting at age 55 if you have a 30-pack-year history of smoking and currently smoke or have quit within the past 15 years.  Prostate cancer screening. Recommendations will vary depending on your family history and other risks.  Colorectal cancer screening. All adults should have this screening starting at age 50 and continuing until age 75. Your health care provider may recommend screening at age 45 if you are at increased risk. You will have tests   every 1-10 years, depending on your results and the type of screening test.  Diabetes screening. This is done by checking your blood sugar (glucose) after you have not eaten for a while (fasting). You may have this done every 1-3  years.  Sexually transmitted disease (STD) testing. Follow these instructions at home: Eating and drinking  Eat a diet that includes fresh fruits and vegetables, whole grains, lean protein, and low-fat dairy products.  Take vitamin and mineral supplements as recommended by your health care provider.  Do not drink alcohol if your health care provider tells you not to drink.  If you drink alcohol: ? Limit how much you have to 0-2 drinks a day. ? Be aware of how much alcohol is in your drink. In the [place] one drink equals one 12 oz bottle of beer (355 mL), one 5 oz glass of wine (148 mL), or one 1 oz glass of hard liquor (44 mL). Lifestyle  Take daily care of your teeth and gums.  Stay active. Exercise for at least 30 minutes on 5 or more days each week.  Do not use any products that contain nicotine or tobacco, such as cigarettes, e-cigarettes, and chewing tobacco. If you need help quitting, ask your health care provider.  If you are sexually active, practice safe sex. Use a condom or other form of protection to prevent STIs (sexually transmitted infections).  Talk with your health care provider about taking a low-dose aspirin every day starting at age 50. What's next?  Go to your health care provider once a year for a well check visit.  Ask your health care provider how often you should have your eyes and teeth checked.  Stay up to date on all vaccines. This information is not intended to replace advice given to you by your health care provider. Make sure you discuss any questions you have with your health care provider. Document Revised: 09/28/2018 Document Reviewed: 09/28/2018 Elsevier Patient Education  2020 [employer]  

## 2019-11-22 NOTE — Assessment & Plan Note (Signed)
Well controlled, no changes to meds. Encouraged heart healthy diet such as the DASH diet and exercise as tolerated.  °

## 2019-11-22 NOTE — Progress Notes (Signed)
Patient ID: Joseph [name], male    DOB: Oct 04, 1956  Age: 64 y.o. MRN: [id number]    Subjective:  Subjective  HPI Gian Ybarra presents for cpe --  No complaints  He will have a knee replacement in March.  Review of Systems  Constitutional: Negative for appetite change, diaphoresis, fatigue, fever and unexpected weight change.  HENT: Negative for congestion.   Eyes: Negative for pain, redness and visual disturbance.  Respiratory: Negative for cough, chest tightness, shortness of breath and wheezing.   Cardiovascular: Negative for chest pain, palpitations and leg swelling.  Gastrointestinal: Negative for vomiting.  Endocrine: Negative for cold intolerance, heat intolerance, polydipsia, polyphagia and polyuria.  Genitourinary: Negative for difficulty urinating, dysuria and frequency.  Musculoskeletal: Negative for back pain.  Skin: Negative for rash.  Neurological: Negative for dizziness, light-headedness, numbness and headaches.    History Past Medical History:  Diagnosis Date  . Hyperlipemia   . Hypertension     He has a past surgical history that includes Exploratory laparotomy and Refractive surgery.   His family history includes Allergic Disorder in his sister; Alzheimer's disease in his mother; Arthritis in his father and mother; Cancer (age of onset: 97) in his mother; Colon cancer in his unknown relative; Healthy in his brother; Pancreatic cancer (age of onset: 8) in his father.He reports that he has never smoked. He has never used smokeless tobacco. He reports current alcohol use of about 2.0 standard drinks of alcohol per week. He reports that he does not use drugs.  Current Outpatient Medications on File Prior to Visit  Medication Sig Dispense Refill  . aspirin 81 MG tablet Take 81 mg by mouth daily.    . Omega-3 Fatty Acids (FISH OIL) 1000 MG CAPS Take by mouth daily.    . rosuvastatin (CRESTOR) 20 MG tablet     . sildenafil (VIAGRA) 100 MG tablet TAKE 0.5-1 TABLETS  (50-100 MG TOTAL) BY MOUTH DAILY AS NEEDED FOR ERECTILE DYSFUNCTION. 35 tablet 0   No current facility-administered medications on file prior to visit.     Objective:  Objective  Physical Exam Vitals and nursing note reviewed.  Constitutional:      General: He is not in acute distress.    Appearance: He is well-developed. He is not diaphoretic.  HENT:     Head: Normocephalic and atraumatic.     Right Ear: External ear normal.     Left Ear: External ear normal.     Nose: Nose normal.     Mouth/Throat:     Pharynx: No oropharyngeal exudate.  Eyes:     General:        Right eye: No discharge.        Left eye: No discharge.     Conjunctiva/sclera: Conjunctivae normal.     Pupils: Pupils are equal, round, and reactive to light.  Neck:     Thyroid: No thyromegaly.     Vascular: No JVD.  Cardiovascular:     Rate and Rhythm: Normal rate and regular rhythm.     Heart sounds: No murmur. No friction rub. No gallop.   Pulmonary:     Effort: Pulmonary effort is normal. No respiratory distress.     Breath sounds: Normal breath sounds. No wheezing or rales.  Chest:     Chest wall: No tenderness.  Abdominal:     General: Bowel sounds are normal. There is no distension.     Palpations: Abdomen is soft. There is no mass.     Tenderness:  There is no abdominal tenderness. There is no guarding or rebound.  Genitourinary:    Penis: Normal.      Prostate: Normal.     Rectum: Normal. Guaiac result negative.  Musculoskeletal:        General: No tenderness. Normal range of motion.     Cervical back: Normal range of motion and neck supple.  Lymphadenopathy:     Cervical: No cervical adenopathy.  Skin:    General: Skin is warm and dry.     Coloration: Skin is not pale.     Findings: No erythema or rash.  Neurological:     Mental Status: He is alert and oriented to person, place, and time.     Motor: No abnormal muscle tone.     Deep Tendon Reflexes: Reflexes are normal and symmetric.  Reflexes normal.  Psychiatric:        Behavior: Behavior normal.        Thought Content: Thought content normal.        Judgment: Judgment normal.    BP 118/84 (BP Location: Right Arm, Patient Position: Sitting, Cuff Size: Large)   Pulse 76   Temp (!) 97.2 F (36.2 C) (Temporal)   Resp 18   Ht 6\' 2"  (1.88 m)   Wt 244 lb (110.7 kg)   SpO2 94%   BMI 31.33 kg/m  Wt Readings from Last 3 Encounters:  11/22/19 244 lb (110.7 kg)  08/14/18 257 lb (116.6 kg)  08/08/18 225 lb 9.6 oz (102.3 kg)     Lab Results  Component Value Date   WBC 6.4 11/22/2019   HGB 15.5 11/22/2019   HCT 46.3 11/22/2019   PLT 182.0 11/22/2019   GLUCOSE 95 11/22/2019   CHOL 133 11/22/2019   TRIG 67.0 11/22/2019   HDL 42.50 11/22/2019   LDLCALC 78 11/22/2019   ALT 27 11/22/2019   AST 21 11/22/2019   NA 138 11/22/2019   K 4.0 11/22/2019   CL 101 11/22/2019   CREATININE 0.77 11/22/2019   BUN 15 11/22/2019   CO2 30 11/22/2019   TSH 1.90 11/22/2019   PSA 3.14 11/22/2019   INR 1.15 12/07/2010   MICROALBUR 0.2 05/01/2013    DG Knee Complete 4 Views Left  Result Date: 05/19/2016 CLINICAL DATA:  Chronic medial left knee pain for approximately 8 months, acutely worsened recently. EXAM: LEFT KNEE - COMPLETE 4+ VIEW COMPARISON:  None. FINDINGS: No evidence of acute, subacute or healed fractures. Moderate medial compartment joint space narrowing with associated hypertrophic spurring. Lateral patellofemoral compartment joint spaces well preserved. Bone mineral density well-preserved. No intrinsic osseous abnormality. No visible joint effusion. IMPRESSION: Moderate medial compartment osteoarthritis. Electronically Signed   By: 07/19/2016 M.D.   On: 05/19/2016 11:58     Assessment & Plan:  Plan  I have changed Virgle Talkington's lisinopril. I am also having him maintain his Fish Oil, aspirin, sildenafil, and rosuvastatin.  Meds ordered this encounter  Medications  . lisinopril (ZESTRIL) 20 MG tablet    Sig:  Take 1 tablet (20 mg total) by mouth daily.    Dispense:  90 tablet    Refill:  3    Problem List Items Addressed This Visit      Unprioritized   Essential hypertension    Well controlled, no changes to meds. Encouraged heart healthy diet such as the DASH diet and exercise as tolerated.       Relevant Medications   rosuvastatin (CRESTOR) 20 MG tablet   lisinopril (ZESTRIL)  20 MG tablet   Other Relevant Orders   Comprehensive metabolic panel   Hyperlipidemia    Tolerating statin, encouraged heart healthy diet, avoid trans fats, minimize simple carbs and saturated fats. Increase exercise as tolerated      Relevant Medications   rosuvastatin (CRESTOR) 20 MG tablet   lisinopril (ZESTRIL) 20 MG tablet   Other Relevant Orders   Lipid panel   Comprehensive metabolic panel   Preventative health care - Primary    ghm utd Check labs  See aVS      Relevant Orders   PSA (Completed)   TSH (Completed)   CBC with Differential/Platelet (Completed)   Lipid panel (Completed)   Comprehensive metabolic panel (Completed)   Lipid panel   CBC with Differential/Platelet   TSH   Comprehensive metabolic panel   PSA    Other Visit Diagnoses    Need for vaccination       Relevant Orders   Flu Vaccine QUAD 6+ mos PF IM (Fluarix Quad PF) (Completed)     Also answered questions about covid vaccine Follow-up: Return in about 6 months (around 05/21/2020), or if symptoms worsen or fail to improve, for hypertension, hyperlipidemia.  [name], DO

## 2019-11-22 NOTE — Assessment & Plan Note (Signed)
Tolerating statin, encouraged heart healthy diet, avoid trans fats, minimize simple carbs and saturated fats. Increase exercise as tolerated 

## 2019-11-22 NOTE — Assessment & Plan Note (Signed)
ghm utd ?Check labs  ?See aVS ? ?

## 2019-12-18 ENCOUNTER — Ambulatory Visit: Payer: 59 | Attending: Internal Medicine

## 2019-12-18 DIAGNOSIS — Z20822 Contact with and (suspected) exposure to covid-19: Secondary | ICD-10-CM

## 2019-12-19 LAB — NOVEL CORONAVIRUS, NAA: SARS-CoV-2, NAA: NOT DETECTED

## 2020-01-17 HISTORY — PX: REPLACEMENT TOTAL KNEE BILATERAL: SUR1225

## 2020-05-27 ENCOUNTER — Encounter: Payer: Self-pay | Admitting: Family Medicine

## 2020-05-27 ENCOUNTER — Other Ambulatory Visit: Payer: Self-pay

## 2020-05-27 ENCOUNTER — Ambulatory Visit: Payer: 59 | Admitting: Family Medicine

## 2020-05-27 VITALS — BP 118/80 | HR 76 | Temp 97.7°F | Resp 18 | Ht 73.0 in | Wt 259.4 lb

## 2020-05-27 DIAGNOSIS — I10 Essential (primary) hypertension: Secondary | ICD-10-CM | POA: Diagnosis not present

## 2020-05-27 DIAGNOSIS — E785 Hyperlipidemia, unspecified: Secondary | ICD-10-CM

## 2020-05-27 LAB — COMPREHENSIVE METABOLIC PANEL
ALT: 26 U/L (ref 0–53)
AST: 20 U/L (ref 0–37)
Albumin: 4.5 g/dL (ref 3.5–5.2)
Alkaline Phosphatase: 60 U/L (ref 39–117)
BUN: 16 mg/dL (ref 6–23)
CO2: 28 mEq/L (ref 19–32)
Calcium: 9.6 mg/dL (ref 8.4–10.5)
Chloride: 103 mEq/L (ref 96–112)
Creatinine, Ser: 0.83 mg/dL (ref 0.40–1.50)
GFR: 93.94 mL/min (ref >60.00)
Glucose, Bld: 104 mg/dL — ABNORMAL HIGH (ref 70–99)
Potassium: 4.2 mEq/L (ref 3.5–5.1)
Sodium: 138 mEq/L (ref 135–145)
Total Bilirubin: 0.5 mg/dL (ref 0.2–1.2)
Total Protein: 7.5 g/dL (ref 6.0–8.3)

## 2020-05-27 LAB — LIPID PANEL
Cholesterol: 136 mg/dL (ref 0–200)
HDL: 47.7 mg/dL (ref >39.00)
LDL Cholesterol: 73 mg/dL (ref 0–99)
NonHDL: 88.64
Total CHOL/HDL Ratio: 3
Triglycerides: 80 mg/dL (ref 0.0–149.0)
VLDL: 16 mg/dL (ref 0.0–40.0)

## 2020-05-27 MED ORDER — ROSUVASTATIN CALCIUM 20 MG PO TABS: 20.0000 mg | ORAL_TABLET | Freq: Every day | ORAL | 1 refills | Status: DC

## 2020-05-27 NOTE — Patient Instructions (Signed)

## 2020-05-27 NOTE — Progress Notes (Signed)
Patient ID: Joseph [name], male    DOB: Apr 09, 1956  Age: 64 y.o. MRN: [id number]        Subjective:  Subjective  HPI  Joseph [name] presents for bp and chol f/u.   Pt doing well after knee replacement.  He had both done a few weeks apart in April       Review of Systems  Constitutional: Negative for chills and fever.  HENT: Negative for congestion and hearing loss.   Eyes: Negative for discharge.  Respiratory: Negative for cough and shortness of breath.   Cardiovascular: Negative for chest pain, palpitations and leg swelling.  Gastrointestinal: Negative for abdominal pain, blood in stool, constipation, diarrhea, nausea and vomiting.  Genitourinary: Negative for dysuria, frequency, hematuria and urgency.  Musculoskeletal: Negative for back pain and myalgias.  Skin: Negative for rash.  Allergic/Immunologic: Negative for environmental allergies.  Neurological: Negative for dizziness, weakness and headaches.  Hematological: Does not bruise/bleed easily.  Psychiatric/Behavioral: Negative for suicidal ideas. The patient is not nervous/anxious.     History Past Medical History:  Diagnosis Date  . Hyperlipemia   . Hypertension     He has a past surgical history that includes Exploratory laparotomy; Refractive surgery; and Replacement total knee bilateral (Bilateral, 01/2020).   His family history includes Allergic Disorder in his sister; Alzheimer's disease in his mother; Arthritis in his father and mother; Cancer (age of onset: 50) in his mother; Colon cancer in an other family member; Healthy in his brother; Pancreatic cancer (age of onset: 20) in his father.He reports that he has never smoked. He has never used smokeless tobacco. He reports current alcohol use of about 2.0 standard drinks of alcohol per week. He reports that he does not use drugs.  Current Outpatient Medications on File Prior to Visit  Medication Sig Dispense Refill  . aspirin 81 MG tablet Take 81 mg by mouth daily.     Marland Kitchen lisinopril (ZESTRIL) 20 MG tablet Take 1 tablet (20 mg total) by mouth daily. 90 tablet 3  . Omega-3 Fatty Acids (FISH OIL) 1000 MG CAPS Take by mouth daily.    . sildenafil (VIAGRA) 100 MG tablet TAKE 0.5-1 TABLETS (50-100 MG TOTAL) BY MOUTH DAILY AS NEEDED FOR ERECTILE DYSFUNCTION. 35 tablet 0   No current facility-administered medications on file prior to visit.     Objective:  Objective  Physical Exam Vitals and nursing note reviewed.  Constitutional:      General: He is not in acute distress.    Appearance: He is well-developed.  Cardiovascular:     Rate and Rhythm: Normal rate and regular rhythm.     Heart sounds: Normal heart sounds.  Pulmonary:     Effort: Pulmonary effort is normal. No respiratory distress.     Breath sounds: Normal breath sounds.  Psychiatric:        Behavior: Behavior normal.        Thought Content: Thought content normal.        Judgment: Judgment normal.    BP 118/80 (BP Location: Right Arm, Patient Position: Sitting, Cuff Size: Large)   Pulse 76   Temp 97.7 F (36.5 C) (Oral)   Resp 18   Ht 6\' 1"  (1.854 m)   Wt 259 lb 6.4 oz (117.7 kg)   SpO2 97%   BMI 34.22 kg/m  Wt Readings from Last 3 Encounters:  05/27/20 259 lb 6.4 oz (117.7 kg)  11/22/19 244 lb (110.7 kg)  08/14/18 257 lb (116.6 kg)  Lab Results  Component Value Date   WBC 6.4 11/22/2019   HGB 15.5 11/22/2019   HCT 46.3 11/22/2019   PLT 182.0 11/22/2019   GLUCOSE 95 11/22/2019   CHOL 133 11/22/2019   TRIG 67.0 11/22/2019   HDL 42.50 11/22/2019   LDLCALC 78 11/22/2019   ALT 27 11/22/2019   AST 21 11/22/2019   NA 138 11/22/2019   K 4.0 11/22/2019   CL 101 11/22/2019   CREATININE 0.77 11/22/2019   BUN 15 11/22/2019   CO2 30 11/22/2019   TSH 1.90 11/22/2019   PSA 3.14 11/22/2019   INR 1.15 12/07/2010   MICROALBUR 0.2 05/01/2013    DG Knee Complete 4 Views Left  Result Date: 05/19/2016 CLINICAL DATA:  Chronic medial left knee pain for approximately 8 months,  acutely worsened recently. EXAM: LEFT KNEE - COMPLETE 4+ VIEW COMPARISON:  None. FINDINGS: No evidence of acute, subacute or healed fractures. Moderate medial compartment joint space narrowing with associated hypertrophic spurring. Lateral patellofemoral compartment joint spaces well preserved. Bone mineral density well-preserved. No intrinsic osseous abnormality. No visible joint effusion. IMPRESSION: Moderate medial compartment osteoarthritis. Electronically Signed   By: Hulan Saas M.D.   On: 05/19/2016 11:58     Assessment & Plan:  Plan  I have changed Joseph [name]'s rosuvastatin. I am also having him maintain his Fish Oil, aspirin, sildenafil, and lisinopril.  Meds ordered this encounter  Medications  . rosuvastatin (CRESTOR) 20 MG tablet    Sig: Take 1 tablet (20 mg total) by mouth daily.    Dispense:  90 tablet    Refill:  1    Problem List Items Addressed This Visit      Unprioritized   Essential hypertension    Well controlled, no changes to meds. Encouraged heart healthy diet such as the DASH diet and exercise as tolerated.        Relevant Medications   rosuvastatin (CRESTOR) 20 MG tablet   Other Relevant Orders   Lipid panel   Comprehensive metabolic panel   Hyperlipidemia    Tolerating statin, encouraged heart healthy diet, avoid trans fats, minimize simple carbs and saturated fats. Increase exercise as tolerated      Relevant Medications   rosuvastatin (CRESTOR) 20 MG tablet    Other Visit Diagnoses    Dyslipidemia    -  Primary   Relevant Medications   rosuvastatin (CRESTOR) 20 MG tablet   Other Relevant Orders   Lipid panel   Comprehensive metabolic panel      Follow-up: Return in about 6 months (around 11/27/2020), or if symptoms worsen or fail to improve, for annual exam, fasting.  Joseph Schultz, DO

## 2020-05-27 NOTE — Assessment & Plan Note (Signed)
Tolerating statin, encouraged heart healthy diet, avoid trans fats, minimize simple carbs and saturated fats. Increase exercise as tolerated 

## 2020-05-27 NOTE — Assessment & Plan Note (Signed)
Well controlled, no changes to meds. Encouraged heart healthy diet such as the DASH diet and exercise as tolerated.  °

## 2020-11-14 ENCOUNTER — Other Ambulatory Visit: Payer: Self-pay | Admitting: Family Medicine

## 2020-11-14 DIAGNOSIS — E785 Hyperlipidemia, unspecified: Secondary | ICD-10-CM

## 2020-12-08 ENCOUNTER — Other Ambulatory Visit: Payer: Self-pay

## 2020-12-09 ENCOUNTER — Ambulatory Visit (INDEPENDENT_AMBULATORY_CARE_PROVIDER_SITE_OTHER): Payer: 59 | Admitting: Family Medicine

## 2020-12-09 ENCOUNTER — Encounter: Payer: Self-pay | Admitting: Family Medicine

## 2020-12-09 ENCOUNTER — Other Ambulatory Visit: Payer: Self-pay

## 2020-12-09 VITALS — BP 142/90 | HR 73 | Temp 97.7°F | Resp 18 | Ht 73.0 in | Wt 267.4 lb

## 2020-12-09 DIAGNOSIS — Z125 Encounter for screening for malignant neoplasm of prostate: Secondary | ICD-10-CM

## 2020-12-09 DIAGNOSIS — Z0000 Encounter for general adult medical examination without abnormal findings: Secondary | ICD-10-CM

## 2020-12-09 DIAGNOSIS — E785 Hyperlipidemia, unspecified: Secondary | ICD-10-CM | POA: Diagnosis not present

## 2020-12-09 DIAGNOSIS — R5383 Other fatigue: Secondary | ICD-10-CM

## 2020-12-09 DIAGNOSIS — I10 Essential (primary) hypertension: Secondary | ICD-10-CM | POA: Diagnosis not present

## 2020-12-09 DIAGNOSIS — N529 Male erectile dysfunction, unspecified: Secondary | ICD-10-CM

## 2020-12-09 LAB — LIPID PANEL
Cholesterol: 137 mg/dL (ref 0–200)
HDL: 47.7 mg/dL (ref >39.00)
LDL Cholesterol: 66 mg/dL (ref 0–99)
NonHDL: 89.09
Total CHOL/HDL Ratio: 3
Triglycerides: 116 mg/dL (ref 0.0–149.0)
VLDL: 23.2 mg/dL (ref 0.0–40.0)

## 2020-12-09 LAB — CBC WITH DIFFERENTIAL/PLATELET
Basophils Absolute: 0 10*3/uL (ref 0.0–0.1)
Basophils Relative: 0.5 % (ref 0.0–3.0)
Eosinophils Absolute: 0.2 10*3/uL (ref 0.0–0.7)
Eosinophils Relative: 3.2 % (ref 0.0–5.0)
HCT: 47 % (ref 39.0–52.0)
Hemoglobin: 15.6 g/dL (ref 13.0–17.0)
Lymphocytes Relative: 28.3 % (ref 12.0–46.0)
Lymphs Abs: 1.7 10*3/uL (ref 0.7–4.0)
MCHC: 33.3 g/dL (ref 30.0–36.0)
MCV: 98.8 fl (ref 78.0–100.0)
Monocytes Absolute: 0.5 10*3/uL (ref 0.1–1.0)
Monocytes Relative: 8.5 % (ref 3.0–12.0)
Neutro Abs: 3.7 10*3/uL (ref 1.4–7.7)
Neutrophils Relative %: 59.5 % (ref 43.0–77.0)
Platelets: 182 10*3/uL (ref 150.0–400.0)
RBC: 4.76 Mil/uL (ref 4.22–5.81)
RDW: 13.2 % (ref 11.5–15.5)
WBC: 6.1 10*3/uL (ref 4.0–10.5)

## 2020-12-09 LAB — COMPREHENSIVE METABOLIC PANEL
ALT: 35 U/L (ref 0–53)
AST: 23 U/L (ref 0–37)
Albumin: 4.6 g/dL (ref 3.5–5.2)
Alkaline Phosphatase: 71 U/L (ref 39–117)
BUN: 10 mg/dL (ref 6–23)
CO2: 31 mEq/L (ref 19–32)
Calcium: 9.9 mg/dL (ref 8.4–10.5)
Chloride: 102 mEq/L (ref 96–112)
Creatinine, Ser: 0.77 mg/dL (ref 0.40–1.50)
GFR: 96.07 mL/min (ref >60.00)
Glucose, Bld: 102 mg/dL — ABNORMAL HIGH (ref 70–99)
Potassium: 4.4 mEq/L (ref 3.5–5.1)
Sodium: 138 mEq/L (ref 135–145)
Total Bilirubin: 0.7 mg/dL (ref 0.2–1.2)
Total Protein: 7.4 g/dL (ref 6.0–8.3)

## 2020-12-09 LAB — PSA: PSA: 2.94 ng/mL (ref 0.10–4.00)

## 2020-12-09 LAB — TSH: TSH: 2.1 u[IU]/mL (ref 0.35–4.50)

## 2020-12-09 MED ORDER — SILDENAFIL CITRATE 100 MG PO TABS: 50.0000 mg | ORAL_TABLET | Freq: Every day | ORAL | 0 refills | Status: DC | PRN

## 2020-12-09 MED ORDER — ROSUVASTATIN CALCIUM 20 MG PO TABS: 20.0000 mg | ORAL_TABLET | Freq: Every day | ORAL | 0 refills | Status: DC

## 2020-12-09 MED ORDER — LISINOPRIL 20 MG PO TABS: 20.0000 mg | ORAL_TABLET | Freq: Every day | ORAL | 3 refills | Status: DC

## 2020-12-09 NOTE — Assessment & Plan Note (Signed)
Well controlled, no changes to meds. Encouraged heart healthy diet such as the DASH diet and exercise as tolerated.  °

## 2020-12-09 NOTE — Patient Instructions (Signed)

## 2020-12-09 NOTE — Assessment & Plan Note (Signed)
ghm utd Check labs  See avs  

## 2020-12-09 NOTE — Progress Notes (Signed)
Patient ID: Joseph [name], male    DOB: 1956-04-30  Age: 65 y.o. MRN: [id number]    Subjective:  Subjective  HPI Joseph [name] presents for cpe and labs  And b/u chol and bp No complaints   Review of Systems  Constitutional: Negative.   HENT: Negative for congestion, ear pain, hearing loss, nosebleeds, postnasal drip, rhinorrhea, sinus pressure, sneezing and tinnitus.   Eyes: Negative for photophobia, discharge, itching and visual disturbance.  Respiratory: Negative.   Cardiovascular: Negative.   Gastrointestinal: Negative for abdominal distention, abdominal pain, anal bleeding, blood in stool and constipation.  Endocrine: Negative.   Genitourinary: Negative.   Musculoskeletal: Negative.   Skin: Negative.   Allergic/Immunologic: Negative.   Neurological: Negative for dizziness, weakness, light-headedness, numbness and headaches.  Psychiatric/Behavioral: Negative for agitation, confusion, decreased concentration, dysphoric mood, sleep disturbance and suicidal ideas. The patient is not nervous/anxious.     History Past Medical History:  Diagnosis Date  . Hyperlipemia   . Hypertension     He has a past surgical history that includes Exploratory laparotomy; Refractive surgery; and Replacement total knee bilateral (Bilateral, 01/2020).   His family history includes Allergic Disorder in his sister; Alzheimer's disease in his mother; Arthritis in his father and mother; Cancer (age of onset: 29) in his mother; Colon cancer in an other family member; Healthy in his brother; Pancreatic cancer (age of onset: 1) in his father.He reports that he has never smoked. He has never used smokeless tobacco. He reports current alcohol use of about 2.0 standard drinks of alcohol per week. He reports that he does not use drugs.  Current Outpatient Medications on File Prior to Visit  Medication Sig Dispense Refill  . aspirin 81 MG tablet Take 81 mg by mouth daily.    . Omega-3 Fatty Acids (FISH OIL) 1000  MG CAPS Take by mouth daily.     No current facility-administered medications on file prior to visit.     Objective:  Objective  Physical Exam Vitals and nursing note reviewed.  Constitutional:      General: He is not in acute distress.    Appearance: He is well-developed and well-nourished. He is not diaphoretic.  HENT:     Head: Normocephalic and atraumatic.     Right Ear: External ear normal.     Left Ear: External ear normal.     Nose: Nose normal.     Mouth/Throat:     Mouth: Oropharynx is clear and moist.     Pharynx: No oropharyngeal exudate.  Eyes:     General:        Right eye: No discharge.        Left eye: No discharge.     Extraocular Movements: EOM normal.     Conjunctiva/sclera: Conjunctivae normal.     Pupils: Pupils are equal, round, and reactive to light.  Neck:     Thyroid: No thyromegaly.     Vascular: No JVD.  Cardiovascular:     Rate and Rhythm: Normal rate and regular rhythm.     Pulses: Intact distal pulses.     Heart sounds: Normal heart sounds. No murmur heard. No friction rub. No gallop.   Pulmonary:     Effort: Pulmonary effort is normal. No respiratory distress.     Breath sounds: Normal breath sounds. No wheezing or rales.  Chest:     Chest wall: No tenderness.  Abdominal:     General: Bowel sounds are normal. There is no distension.  Palpations: Abdomen is soft. There is no mass.     Tenderness: There is no abdominal tenderness. There is no guarding or rebound.  Genitourinary:    Penis: Normal.      Prostate: Normal.     Rectum: Normal. Guaiac result negative.  Musculoskeletal:        General: No tenderness or edema. Normal range of motion.     Cervical back: Normal range of motion and neck supple.  Lymphadenopathy:     Cervical: No cervical adenopathy.  Skin:    General: Skin is warm and dry.     Coloration: Skin is not pale.     Findings: No erythema or rash.  Neurological:     Mental Status: He is alert and oriented to  person, place, and time.     Motor: No abnormal muscle tone.     Deep Tendon Reflexes: Reflexes are normal and symmetric. Reflexes normal.  Psychiatric:        Mood and Affect: Mood and affect normal.        Behavior: Behavior normal.        Thought Content: Thought content normal.        Judgment: Judgment normal.    BP (!) 142/90 (BP Location: Right Arm, Patient Position: Sitting, Cuff Size: Normal)   Pulse 73   Temp 97.7 F (36.5 C) (Oral)   Resp 18   Ht 6\' 1"  (1.854 m)   Wt 267 lb 6.4 oz (121.3 kg)   SpO2 96%   BMI 35.28 kg/m  Wt Readings from Last 3 Encounters:  12/09/20 267 lb 6.4 oz (121.3 kg)  05/27/20 259 lb 6.4 oz (117.7 kg)  11/22/19 244 lb (110.7 kg)     Lab Results  Component Value Date   WBC 6.4 11/22/2019   HGB 15.5 11/22/2019   HCT 46.3 11/22/2019   PLT 182.0 11/22/2019   GLUCOSE 104 (H) 05/27/2020   CHOL 136 05/27/2020   TRIG 80.0 05/27/2020   HDL 47.70 05/27/2020   LDLCALC 73 05/27/2020   ALT 26 05/27/2020   AST 20 05/27/2020   NA 138 05/27/2020   K 4.2 05/27/2020   CL 103 05/27/2020   CREATININE 0.83 05/27/2020   BUN 16 05/27/2020   CO2 28 05/27/2020   TSH 1.90 11/22/2019   PSA 3.14 11/22/2019   INR 1.15 12/07/2010   MICROALBUR 0.2 05/01/2013    DG Knee Complete 4 Views Left  Result Date: 05/19/2016 CLINICAL DATA:  Chronic medial left knee pain for approximately 8 months, acutely worsened recently. EXAM: LEFT KNEE - COMPLETE 4+ VIEW COMPARISON:  None. FINDINGS: No evidence of acute, subacute or healed fractures. Moderate medial compartment joint space narrowing with associated hypertrophic spurring. Lateral patellofemoral compartment joint spaces well preserved. Bone mineral density well-preserved. No intrinsic osseous abnormality. No visible joint effusion. IMPRESSION: Moderate medial compartment osteoarthritis. Electronically Signed   By: 07/19/2016 M.D.   On: 05/19/2016 11:58     Assessment & Plan:  Plan  I have changed Joseph  [name]'s rosuvastatin. I am also having him maintain his Fish Oil, aspirin, lisinopril, and sildenafil.  Meds ordered this encounter  Medications  . lisinopril (ZESTRIL) 20 MG tablet    Sig: Take 1 tablet (20 mg total) by mouth daily.    Dispense:  90 tablet    Refill:  3  . rosuvastatin (CRESTOR) 20 MG tablet    Sig: Take 1 tablet (20 mg total) by mouth daily.    Dispense:  90  tablet    Refill:  0  . sildenafil (VIAGRA) 100 MG tablet    Sig: Take 0.5-1 tablets (50-100 mg total) by mouth daily as needed for erectile dysfunction.    Dispense:  35 tablet    Refill:  0    Problem List Items Addressed This Visit      Unprioritized   Erectile dysfunction   Relevant Medications   sildenafil (VIAGRA) 100 MG tablet   Essential hypertension    Well controlled, no changes to meds. Encouraged heart healthy diet such as the DASH diet and exercise as tolerated.       Relevant Medications   lisinopril (ZESTRIL) 20 MG tablet   rosuvastatin (CRESTOR) 20 MG tablet   sildenafil (VIAGRA) 100 MG tablet   Hyperlipidemia    Tolerating statin, encouraged heart healthy diet, avoid trans fats, minimize simple carbs and saturated fats. Increase exercise as tolerated      Relevant Medications   lisinopril (ZESTRIL) 20 MG tablet   rosuvastatin (CRESTOR) 20 MG tablet   sildenafil (VIAGRA) 100 MG tablet   Other Relevant Orders   Lipid panel   Preventative health care    ghm utd Check labs See avs       Relevant Orders   TSH   PSA   Lipid panel   CBC with Differential/Platelet   Comprehensive metabolic panel   Testos,Total,Free and SHBG (Male)    Other Visit Diagnoses    Other fatigue    -  Primary   Relevant Orders   CBC with Differential/Platelet   Comprehensive metabolic panel   Testos,Total,Free and SHBG (Male)   Primary hypertension       Relevant Medications   lisinopril (ZESTRIL) 20 MG tablet   rosuvastatin (CRESTOR) 20 MG tablet   sildenafil (VIAGRA) 100 MG tablet    Other Relevant Orders   CBC with Differential/Platelet   Comprehensive metabolic panel   Dyslipidemia       Relevant Medications   rosuvastatin (CRESTOR) 20 MG tablet      Follow-up: Return in about 6 months (around 06/08/2021), or if symptoms worsen or fail to improve, for hypertension, hyperlipidemia.  [name], DO

## 2020-12-09 NOTE — Assessment & Plan Note (Signed)
Tolerating statin, encouraged heart healthy diet, avoid trans fats, minimize simple carbs and saturated fats. Increase exercise as tolerated 

## 2020-12-12 ENCOUNTER — Encounter: Payer: Self-pay | Admitting: Family Medicine

## 2020-12-12 LAB — TESTOS,TOTAL,FREE AND SHBG (FEMALE)
Free Testosterone: 79.6 pg/mL (ref 35.0–155.0)
Sex Hormone Binding: 26 nmol/L (ref 22–77)
Testosterone, Total, LC-MS-MS: 400 ng/dL (ref 250–1100)

## 2021-02-13 ENCOUNTER — Other Ambulatory Visit: Payer: Self-pay | Admitting: Family Medicine

## 2021-02-13 DIAGNOSIS — E785 Hyperlipidemia, unspecified: Secondary | ICD-10-CM

## 2021-04-22 ENCOUNTER — Telehealth: Payer: Self-pay | Admitting: Family Medicine

## 2021-04-22 DIAGNOSIS — E785 Hyperlipidemia, unspecified: Secondary | ICD-10-CM

## 2021-04-22 DIAGNOSIS — I10 Essential (primary) hypertension: Secondary | ICD-10-CM

## 2021-04-22 MED ORDER — LISINOPRIL 20 MG PO TABS: 20.0000 mg | ORAL_TABLET | Freq: Every day | ORAL | 1 refills | Status: DC

## 2021-04-22 MED ORDER — ROSUVASTATIN CALCIUM 20 MG PO TABS
20.0000 mg | ORAL_TABLET | Freq: Every day | ORAL | 1 refills | Status: DC
Start: 2021-04-22 — End: 2022-03-29

## 2021-04-22 NOTE — Telephone Encounter (Signed)
Refill sent.

## 2021-04-22 NOTE — Telephone Encounter (Signed)
New pharmacy   Medication: rosuvastatin (CRESTOR) 20 MG tablet [[id number]]   lisinopril (ZESTRIL) 20 MG tablet [[id number]]     Has the patient contacted their pharmacy? No. (If no, request that the patient contact the pharmacy for the refill.) (If yes, when and what did the pharmacy advise?)     Preferred Pharmacy (with phone number or street name):  [facility] - [place], [place], [place] - [address] Phone:  [phone]  Fax:  [phone]         Agent: Please be advised that RX refills may take up to 3 business days. We ask that you follow-up with your pharmacy.

## 2021-06-08 ENCOUNTER — Ambulatory Visit: Payer: 59 | Admitting: Family Medicine

## 2022-03-29 ENCOUNTER — Ambulatory Visit (INDEPENDENT_AMBULATORY_CARE_PROVIDER_SITE_OTHER): Payer: 59 | Admitting: Family Medicine

## 2022-03-29 ENCOUNTER — Encounter: Payer: Self-pay | Admitting: Family Medicine

## 2022-03-29 VITALS — BP 120/70 | HR 81 | Temp 98.2°F | Resp 18 | Ht 73.0 in | Wt 271.4 lb

## 2022-03-29 DIAGNOSIS — H60502 Unspecified acute noninfective otitis externa, left ear: Secondary | ICD-10-CM | POA: Insufficient documentation

## 2022-03-29 DIAGNOSIS — E785 Hyperlipidemia, unspecified: Secondary | ICD-10-CM

## 2022-03-29 DIAGNOSIS — I10 Essential (primary) hypertension: Secondary | ICD-10-CM

## 2022-03-29 MED ORDER — CEFDINIR 300 MG PO CAPS: 300.0000 mg | ORAL_CAPSULE | Freq: Two times a day (BID) | ORAL | 0 refills | Status: DC

## 2022-03-29 MED ORDER — ROSUVASTATIN CALCIUM 20 MG PO TABS: 20.0000 mg | ORAL_TABLET | Freq: Every day | ORAL | 1 refills | Status: DC

## 2022-03-29 MED ORDER — OFLOXACIN 0.3 % OT SOLN: 10.0000 [drp] | Freq: Every day | OTIC | 0 refills | Status: DC

## 2022-03-29 MED ORDER — LISINOPRIL 20 MG PO TABS: 20.0000 mg | ORAL_TABLET | Freq: Every day | ORAL | 1 refills | Status: DC

## 2022-03-29 NOTE — Assessment & Plan Note (Signed)
floxin otic gtts  abx per orders  rto prn

## 2022-03-29 NOTE — Progress Notes (Addendum)
Subjective:   By signing my name below, I, Cassell Clement, attest that this documentation has been prepared under the direction and in the presence of [name] DO 03/29/2022    Patient ID: Joseph [name], male    DOB: [birth year], 66 y.o.   MRN: [id number]  Chief Complaint  Patient presents with   Ear Pain    X4-5 days, left ear. Pt states ear is "stuffed" up.     HPI Patient is in today for an office visit.  He complains of a possible outer ear infection in his left ear. He states that he uses alcohol in the area. He also states that as of today, his pain is gone but his left ear feels "stopped up."   Past Medical History:  Diagnosis Date   Hyperlipemia    Hypertension     Past Surgical History:  Procedure Laterality Date   EXPLORATORY LAPAROTOMY     for lacerated liver in high school   REFRACTIVE SURGERY     REPLACEMENT TOTAL KNEE BILATERAL Bilateral 01/2020   a few weeks apart---  Dr Simeon Craft --- [facility]    Family History  Problem Relation Age of Onset   Pancreatic cancer Father 72       Deceased   Arthritis Father    Arthritis Mother    Alzheimer's disease Mother        Living   Cancer Mother 22       colon   Colon cancer Other    Allergic Disorder Sister    Healthy Brother     Social History   Socioeconomic History   Marital status: Married    Spouse name: Not on file   Number of children: 4   Years of education: Not on file   Highest education level: Not on file  Occupational History   Not on file  Tobacco Use   Smoking status: Never   Smokeless tobacco: Never  Substance and Sexual Activity   Alcohol use: Yes    Alcohol/week: 2.0 standard drinks of alcohol    Types: 2 Glasses of wine per week    Comment: daily - 2-3 bottles of wine per week   Drug use: No   Sexual activity: Yes    Partners: Female  Other Topics Concern   Not on file  Social History Narrative   Exercise -no   Lives with wife in a two story home.  Has 4 children.      Works as an Art gallery manager.     Education: college   Social Determinants of Corporate investment banker Strain: Not on file  Food Insecurity: Not on file  Transportation Needs: Not on file  Physical Activity: Not on file  Stress: Not on file  Social Connections: Not on file  Intimate Partner Violence: Not on file    Outpatient Medications Prior to Visit  Medication Sig Dispense Refill   aspirin 81 MG tablet Take 81 mg by mouth daily.     Omega-3 Fatty Acids (FISH OIL) 1000 MG CAPS Take by mouth daily.     sildenafil (VIAGRA) 100 MG tablet Take 0.5-1 tablets (50-100 mg total) by mouth daily as needed for erectile dysfunction. 35 tablet 0   lisinopril (ZESTRIL) 20 MG tablet Take 1 tablet (20 mg total) by mouth daily. 90 tablet 1   rosuvastatin (CRESTOR) 20 MG tablet Take 1 tablet (20 mg total) by mouth daily. 90 tablet 1   No facility-administered medications prior to visit.  No Known Allergies  Review of Systems  Constitutional:  Negative for fever and malaise/fatigue.  HENT:  Positive for ear pain. Negative for congestion.        (+) Ear Infection in the Left Ear  Eyes:  Negative for blurred vision.  Respiratory:  Negative for cough and shortness of breath.   Cardiovascular:  Negative for chest pain, palpitations and leg swelling.  Gastrointestinal:  Negative for vomiting.  Musculoskeletal:  Negative for back pain.  Skin:  Negative for rash.  Neurological:  Negative for loss of consciousness and headaches.       Objective:    Physical Exam Vitals and nursing note reviewed.  Constitutional:      General: He is not in acute distress.    Appearance: Normal appearance. He is not ill-appearing.  HENT:     Head: Normocephalic and atraumatic.     Right Ear: Hearing, ear canal and external ear normal. There is no impacted cerumen.     Left Ear: Hearing and external ear normal. There is no impacted cerumen. Tympanic membrane is injected and erythematous.     Nose: Nose normal.   Eyes:     Extraocular Movements: Extraocular movements intact.     Pupils: Pupils are equal, round, and reactive to light.  Cardiovascular:     Rate and Rhythm: Normal rate and regular rhythm.     Heart sounds: Normal heart sounds. No murmur heard.    No gallop.  Pulmonary:     Effort: Pulmonary effort is normal. No respiratory distress.     Breath sounds: Normal breath sounds. No wheezing or rales.  Skin:    General: Skin is warm and dry.  Neurological:     Mental Status: He is alert and oriented to person, place, and time.  Psychiatric:        Judgment: Judgment normal.     BP 120/70 (BP Location: Right Arm, Patient Position: Sitting, Cuff Size: Large)   Pulse 81   Temp 98.2 F (36.8 C) (Oral)   Resp 18   Ht 6\' 1"  (1.854 m)   Wt 271 lb 6.4 oz (123.1 kg)   SpO2 95%   BMI 35.81 kg/m  Wt Readings from Last 3 Encounters:  03/29/22 271 lb 6.4 oz (123.1 kg)  12/09/20 267 lb 6.4 oz (121.3 kg)  05/27/20 259 lb 6.4 oz (117.7 kg)    Diabetic Foot Exam - Simple   No data filed    Lab Results  Component Value Date   WBC 6.1 12/09/2020   HGB 15.6 12/09/2020   HCT 47.0 12/09/2020   PLT 182.0 12/09/2020   GLUCOSE 102 (H) 12/09/2020   CHOL 137 12/09/2020   TRIG 116.0 12/09/2020   HDL 47.70 12/09/2020   LDLCALC 66 12/09/2020   ALT 35 12/09/2020   AST 23 12/09/2020   NA 138 12/09/2020   K 4.4 12/09/2020   CL 102 12/09/2020   CREATININE 0.77 12/09/2020   BUN 10 12/09/2020   CO2 31 12/09/2020   TSH 2.10 12/09/2020   PSA 2.94 12/09/2020   INR 1.15 12/07/2010   MICROALBUR 0.2 05/01/2013    Lab Results  Component Value Date   TSH 2.10 12/09/2020   Lab Results  Component Value Date   WBC 6.1 12/09/2020   HGB 15.6 12/09/2020   HCT 47.0 12/09/2020   MCV 98.8 12/09/2020   PLT 182.0 12/09/2020   Lab Results  Component Value Date   NA 138 12/09/2020   K 4.4 12/09/2020  CO2 31 12/09/2020   GLUCOSE 102 (H) 12/09/2020   BUN 10 12/09/2020   CREATININE 0.77  12/09/2020   BILITOT 0.7 12/09/2020   ALKPHOS 71 12/09/2020   AST 23 12/09/2020   ALT 35 12/09/2020   PROT 7.4 12/09/2020   ALBUMIN 4.6 12/09/2020   CALCIUM 9.9 12/09/2020   GFR 96.07 12/09/2020   Lab Results  Component Value Date   CHOL 137 12/09/2020   Lab Results  Component Value Date   HDL 47.70 12/09/2020   Lab Results  Component Value Date   LDLCALC 66 12/09/2020   Lab Results  Component Value Date   TRIG 116.0 12/09/2020   Lab Results  Component Value Date   CHOLHDL 3 12/09/2020   No results found for: "HGBA1C"     Assessment & Plan:   Problem List Items Addressed This Visit       Unprioritized   Essential hypertension    Well controlled, no changes to meds. Encouraged heart healthy diet such as the DASH diet and exercise as tolerated.       Relevant Medications   lisinopril (ZESTRIL) 20 MG tablet   rosuvastatin (CRESTOR) 20 MG tablet   Acute otitis externa of left ear - Primary    floxin otic gtts  abx per orders  rto prn       Relevant Medications   ofloxacin (FLOXIN OTIC) 0.3 % OTIC solution   cefdinir (OMNICEF) 300 MG capsule   Other Visit Diagnoses     Dyslipidemia       Relevant Medications   rosuvastatin (CRESTOR) 20 MG tablet       Meds ordered this encounter  Medications   lisinopril (ZESTRIL) 20 MG tablet    Sig: Take 1 tablet (20 mg total) by mouth daily.    Dispense:  90 tablet    Refill:  1   rosuvastatin (CRESTOR) 20 MG tablet    Sig: Take 1 tablet (20 mg total) by mouth daily.    Dispense:  90 tablet    Refill:  1   ofloxacin (FLOXIN OTIC) 0.3 % OTIC solution    Sig: Place 10 drops into the left ear daily.    Dispense:  10 mL    Refill:  0   cefdinir (OMNICEF) 300 MG capsule    Sig: Take 1 capsule (300 mg total) by mouth 2 (two) times daily.    Dispense:  20 capsule    Refill:  0    I, Donato SchultzYvonne R Lowne Chase, DO, personally preformed the services described in this documentation.  All medical record entries made  by the scribe were at my direction and in my presence.  I have reviewed the chart and discharge instructions (if applicable) and agree that the record reflects my personal performance and is accurate and complete. 03/29/2022   I,Amber Collins,acting as a scribe for Fisher ScientificYvonne R Lowne Chase, DO.,have documented all relevant documentation on the behalf of Donato SchultzYvonne R Lowne Chase, DO,as directed by  Donato SchultzYvonne R Lowne Chase, DO while in the presence of Donato SchultzYvonne R Lowne Chase, DO.  Donato SchultzYvonne R Lowne Chase, DO

## 2022-03-29 NOTE — Assessment & Plan Note (Signed)
Well controlled, no changes to meds. Encouraged heart healthy diet such as the DASH diet and exercise as tolerated.  °

## 2022-03-29 NOTE — Patient Instructions (Signed)
Otitis Externa  Otitis externa is an infection of the outer ear canal. The outer ear canal is the area between the outside of the ear and the eardrum. Otitis externa is sometimes called swimmer's ear. What are the causes? Common causes of this condition include: Swimming in dirty water. Moisture in the ear. An injury to the inside of the ear. An object stuck in the ear. A cut or scrape on the outside of the ear or in the ear canal. What increases the risk? You are more likely to develop this condition if you go swimming often. What are the signs or symptoms? The first symptom of this condition is often itching in the ear. Later symptoms of the condition include: Swelling of the ear. Redness in the ear. Ear pain. The pain may get worse when you pull on your ear. Pus coming from the ear. How is this diagnosed? This condition may be diagnosed by examining the ear and testing fluid from the ear for bacteria and funguses. How is this treated? This condition may be treated with: Antibiotic ear drops. These are often given for 10-14 days. Medicines to reduce itching and swelling. Follow these instructions at home: If you were prescribed antibiotic ear drops, use them as told by your health care provider. Do not stop using the antibiotic even if you start to feel better. Take over-the-counter and prescription medicines only as told by your health care provider. Avoid getting water in your ears as told by your health care provider. This may include avoiding swimming or water sports for a few days. Keep all follow-up visits. This is important. How is this prevented? Keep your ears dry. Use the corner of a towel to dry your ears after you swim or bathe. Avoid scratching or putting things in your ear. Doing these things can damage the ear canal or remove the protective wax that lines it, which makes it easier for bacteria and funguses to grow. Avoid swimming in lakes, polluted water, or swimming  pools that may not have enough chlorine. Contact a health care provider if: You have a fever. Your ear is still red, swollen, painful, or draining pus after 3 days. Your redness, swelling, or pain gets worse. You have a severe headache. Get help right away if: You have redness, swelling, and pain or tenderness in the area behind your ear. Summary Otitis externa is an infection of the outer ear canal. Common causes include swimming in dirty water, moisture in the ear, or a cut or scrape in the ear. Symptoms include pain, redness, and swelling of the ear canal. If you were prescribed antibiotic ear drops, use them as told by your health care provider. Do not stop using the antibiotic even if you start to feel better. This information is not intended to replace advice given to you by your health care provider. Make sure you discuss any questions you have with your health care provider. Document Revised: 12/17/2020 Document Reviewed: 12/17/2020 Elsevier Patient Education  2023 Elsevier Inc.  

## 2022-07-26 ENCOUNTER — Ambulatory Visit: Payer: 59 | Admitting: Family Medicine

## 2023-10-14 ENCOUNTER — Encounter: Payer: Medicare Other | Admitting: Family Medicine

## 2023-10-14 ENCOUNTER — Encounter: Payer: Self-pay | Admitting: Family Medicine

## 2023-10-14 ENCOUNTER — Ambulatory Visit (INDEPENDENT_AMBULATORY_CARE_PROVIDER_SITE_OTHER): Payer: Medicare Other | Admitting: Family Medicine

## 2023-10-14 VITALS — BP 114/70 | HR 75 | Temp 98.9°F | Resp 18 | Ht 73.0 in | Wt 274.4 lb

## 2023-10-14 DIAGNOSIS — E785 Hyperlipidemia, unspecified: Secondary | ICD-10-CM

## 2023-10-14 DIAGNOSIS — I10 Essential (primary) hypertension: Secondary | ICD-10-CM | POA: Diagnosis not present

## 2023-10-14 DIAGNOSIS — R351 Nocturia: Secondary | ICD-10-CM | POA: Diagnosis not present

## 2023-10-14 DIAGNOSIS — Z23 Encounter for immunization: Secondary | ICD-10-CM | POA: Diagnosis not present

## 2023-10-14 DIAGNOSIS — N529 Male erectile dysfunction, unspecified: Secondary | ICD-10-CM

## 2023-10-14 DIAGNOSIS — Z0000 Encounter for general adult medical examination without abnormal findings: Secondary | ICD-10-CM

## 2023-10-14 DIAGNOSIS — I4510 Unspecified right bundle-branch block: Secondary | ICD-10-CM

## 2023-10-14 LAB — PSA: PSA: 3.98 ng/mL (ref 0.10–4.00)

## 2023-10-14 LAB — CBC WITH DIFFERENTIAL/PLATELET
Basophils Absolute: 0 10*3/uL (ref 0.0–0.1)
Basophils Relative: 0.8 % (ref 0.0–3.0)
Eosinophils Absolute: 0.2 10*3/uL (ref 0.0–0.7)
Eosinophils Relative: 2.6 % (ref 0.0–5.0)
HCT: 49.2 % (ref 39.0–52.0)
Hemoglobin: 16.6 g/dL (ref 13.0–17.0)
Lymphocytes Relative: 29.5 % (ref 12.0–46.0)
Lymphs Abs: 1.8 10*3/uL (ref 0.7–4.0)
MCHC: 33.7 g/dL (ref 30.0–36.0)
MCV: 100.2 fL — ABNORMAL HIGH (ref 78.0–100.0)
Monocytes Absolute: 0.5 10*3/uL (ref 0.1–1.0)
Monocytes Relative: 8.3 % (ref 3.0–12.0)
Neutro Abs: 3.6 10*3/uL (ref 1.4–7.7)
Neutrophils Relative %: 58.8 % (ref 43.0–77.0)
Platelets: 203 10*3/uL (ref 150.0–400.0)
RBC: 4.91 Mil/uL (ref 4.22–5.81)
RDW: 12.8 % (ref 11.5–15.5)
WBC: 6.2 10*3/uL (ref 4.0–10.5)

## 2023-10-14 LAB — LIPID PANEL
Cholesterol: 157 mg/dL (ref 0–200)
HDL: 40.2 mg/dL (ref >39.00)
LDL Cholesterol: 89 mg/dL (ref 0–99)
NonHDL: 116.95
Total CHOL/HDL Ratio: 4
Triglycerides: 138 mg/dL (ref 0.0–149.0)
VLDL: 27.6 mg/dL (ref 0.0–40.0)

## 2023-10-14 LAB — COMPREHENSIVE METABOLIC PANEL
ALT: 33 U/L (ref 0–53)
AST: 27 U/L (ref 0–37)
Albumin: 4.7 g/dL (ref 3.5–5.2)
Alkaline Phosphatase: 78 U/L (ref 39–117)
BUN: 15 mg/dL (ref 6–23)
CO2: 31 meq/L (ref 19–32)
Calcium: 9.9 mg/dL (ref 8.4–10.5)
Chloride: 102 meq/L (ref 96–112)
Creatinine, Ser: 0.82 mg/dL (ref 0.40–1.50)
GFR: 92.39 mL/min (ref >60.00)
Glucose, Bld: 103 mg/dL — ABNORMAL HIGH (ref 70–99)
Potassium: 4.9 meq/L (ref 3.5–5.1)
Sodium: 139 meq/L (ref 135–145)
Total Bilirubin: 0.8 mg/dL (ref 0.2–1.2)
Total Protein: 7.3 g/dL (ref 6.0–8.3)

## 2023-10-14 LAB — TSH: TSH: 2.55 u[IU]/mL (ref 0.35–5.50)

## 2023-10-14 MED ORDER — ROSUVASTATIN CALCIUM 20 MG PO TABS: 20.0000 mg | ORAL_TABLET | Freq: Every day | ORAL | 1 refills | Status: DC

## 2023-10-14 MED ORDER — LISINOPRIL 20 MG PO TABS: 20.0000 mg | ORAL_TABLET | Freq: Every day | ORAL | 1 refills | Status: DC

## 2023-10-14 MED ORDER — SILDENAFIL CITRATE 100 MG PO TABS: 50.0000 mg | ORAL_TABLET | Freq: Every day | ORAL | 0 refills | Status: DC | PRN

## 2023-10-14 NOTE — Patient Instructions (Signed)
Preventive Care 65 Years and Older, Male Preventive care refers to lifestyle choices and visits with your health care provider that can promote health and wellness. Preventive care visits are also called wellness exams. What can I expect for my preventive care visit? Counseling During your preventive care visit, your health care provider may ask about your: Medical history, including: Past medical problems. Family medical history. History of falls. Current health, including: Emotional well-being. Home life and relationship well-being. Sexual activity. Memory and ability to understand (cognition). Lifestyle, including: Alcohol, nicotine or tobacco, and drug use. Access to firearms. Diet, exercise, and sleep habits. Work and work environment. Sunscreen use. Safety issues such as seatbelt and bike helmet use. Physical exam Your health care provider will check your: Height and weight. These may be used to calculate your BMI (body mass index). BMI is a measurement that tells if you are at a healthy weight. Waist circumference. This measures the distance around your waistline. This measurement also tells if you are at a healthy weight and may help predict your risk of certain diseases, such as type 2 diabetes and high blood pressure. Heart rate and blood pressure. Body temperature. Skin for abnormal spots. What immunizations do I need?  Vaccines are usually given at various ages, according to a schedule. Your health care provider will recommend vaccines for you based on your age, medical history, and lifestyle or other factors, such as travel or where you work. What tests do I need? Screening Your health care provider may recommend screening tests for certain conditions. This may include: Lipid and cholesterol levels. Diabetes screening. This is done by checking your blood sugar (glucose) after you have not eaten for a while (fasting). Hepatitis C test. Hepatitis B test. HIV (human  immunodeficiency virus) test. STI (sexually transmitted infection) testing, if you are at risk. Lung cancer screening. Colorectal cancer screening. Prostate cancer screening. Abdominal aortic aneurysm (AAA) screening. You may need this if you are a current or former smoker. Talk with your health care provider about your test results, treatment options, and if necessary, the need for more tests. Follow these instructions at home: Eating and drinking  Eat a diet that includes fresh fruits and vegetables, whole grains, lean protein, and low-fat dairy products. Limit your intake of foods with high amounts of sugar, saturated fats, and salt. Take vitamin and mineral supplements as recommended by your health care provider. Do not drink alcohol if your health care provider tells you not to drink. If you drink alcohol: Limit how much you have to 0-2 drinks a day. Know how much alcohol is in your drink. In the [place] one drink equals one 12 oz bottle of beer (355 mL), one 5 oz glass of wine (148 mL), or one 1 oz glass of hard liquor (44 mL). Lifestyle Brush your teeth every morning and night with fluoride toothpaste. Floss one time each day. Exercise for at least 30 minutes 5 or more days each week. Do not use any products that contain nicotine or tobacco. These products include cigarettes, chewing tobacco, and vaping devices, such as e-cigarettes. If you need help quitting, ask your health care provider. Do not use drugs. If you are sexually active, practice safe sex. Use a condom or other form of protection to prevent STIs. Take aspirin only as told by your health care provider. Make sure that you understand how much to take and what form to take. Work with your health care provider to find out whether it is safe   and beneficial for you to take aspirin daily. Ask your health care provider if you need to take a cholesterol-lowering medicine (statin). Find healthy ways to manage stress, such  as: Meditation, yoga, or listening to music. Journaling. Talking to a trusted person. Spending time with friends and family. Safety Always wear your seat belt while driving or riding in a vehicle. Do not drive: If you have been drinking alcohol. Do not ride with someone who has been drinking. When you are tired or distracted. While texting. If you have been using any mind-altering substances or drugs. Wear a helmet and other protective equipment during sports activities. If you have firearms in your house, make sure you follow all gun safety procedures. Minimize exposure to UV radiation to reduce your risk of skin cancer. What's next? Visit your health care provider once a year for an annual wellness visit. Ask your health care provider how often you should have your eyes and teeth checked. Stay up to date on all vaccines. This information is not intended to replace advice given to you by your health care provider. Make sure you discuss any questions you have with your health care provider. Document Revised: 04/01/2021 Document Reviewed: 04/01/2021 Elsevier Patient Education  2024 [employer] Inc.  

## 2023-10-14 NOTE — Progress Notes (Signed)
Subjective:    Joseph [name] is a 67 y.o. male who presents for a Welcome to Medicare exam.    Discussed the use of AI scribe software for clinical note transcription with the patient, who gave verbal consent to proceed.  History of Present Illness   The patient, with a history of hypertension and hyperlipidemia, presents for a routine physical. He reports no new health issues and has been adhering to his prescribed medications, including lisinopril and rosuvastatin. He also mentions taking fish oil and aspirin 81 mg daily. The patient has been out of refills for lisinopril and rosuvastatin and requests a renewal. He also expresses interest in obtaining Viagra, which he has used in the past.  The patient has a history of low testosterone levels and was previously on testosterone boosting therapy. However, he has not been taking testosterone for a year and is unsure of his current levels. He expresses a lack of energy and vitality, but does not attribute this to his testosterone levels.  The patient also mentions a family history of psoriatic arthritis in his father. His son, a former Careers adviser, has been diagnosed with rheumatoid arthritis and has had to stop practicing due to the disease's impact on his hands. The patient does not report any personal history of arthritis.  The patient has been keeping active and recently started a regular exercise routine in his new home's gym. He reports no issues with vision, memory, falls, or depression. He has received his COVID-19 booster shot and is open to receiving the flu shot and pneumonia vaccine during the visit.          Objective:    Today's Vitals   10/14/23 1035  BP: 114/70  Pulse: 75  Resp: 18  Temp: 98.9 F (37.2 C)  TempSrc: Oral  SpO2: 97%  Weight: 274 lb 6.4 oz (124.5 kg)  Height: 6\' 1"  (1.854 m)   Body mass index is 36.2 kg/m.  Medications Outpatient Encounter Medications as of 10/14/2023  Medication Sig   aspirin 81 MG  tablet Take 81 mg by mouth daily.   Omega-3 Fatty Acids (FISH OIL) 1000 MG CAPS Take by mouth daily.   [DISCONTINUED] lisinopril (ZESTRIL) 20 MG tablet Take 1 tablet (20 mg total) by mouth daily.   [DISCONTINUED] ofloxacin (FLOXIN OTIC) 0.3 % OTIC solution Place 10 drops into the left ear daily.   [DISCONTINUED] rosuvastatin (CRESTOR) 20 MG tablet Take 1 tablet (20 mg total) by mouth daily.   [DISCONTINUED] sildenafil (VIAGRA) 100 MG tablet Take 0.5-1 tablets (50-100 mg total) by mouth daily as needed for erectile dysfunction.   lisinopril (ZESTRIL) 20 MG tablet Take 1 tablet (20 mg total) by mouth daily.   rosuvastatin (CRESTOR) 20 MG tablet Take 1 tablet (20 mg total) by mouth daily.   sildenafil (VIAGRA) 100 MG tablet Take 0.5-1 tablets (50-100 mg total) by mouth daily as needed for erectile dysfunction.   [DISCONTINUED] cefdinir (OMNICEF) 300 MG capsule Take 1 capsule (300 mg total) by mouth 2 (two) times daily.   No facility-administered encounter medications on file as of 10/14/2023.     History: Past Medical History:  Diagnosis Date   Hyperlipemia    Hypertension    Past Surgical History:  Procedure Laterality Date   EXPLORATORY LAPAROTOMY     for lacerated liver in high school   REFRACTIVE SURGERY     REPLACEMENT TOTAL KNEE BILATERAL Bilateral 01/2020   a few weeks apart---  Dr Simeon Craft --- [facility]  Family History  Problem Relation Age of Onset   Arthritis Mother    Alzheimer's disease Mother        Living   Cancer Mother 80       colon   Pancreatic cancer Father 34       Deceased   Arthritis Father    Psoriasis Father    Allergic Disorder Sister    Healthy Brother    Colon cancer Other    Rheum arthritis Son    Social History   Occupational History   Not on file  Tobacco Use   Smoking status: Never   Smokeless tobacco: Never  Substance and Sexual Activity   Alcohol use: Yes    Alcohol/week: 2.0 standard drinks of alcohol    Types: 2 Glasses of wine per  week    Comment: daily - 2-3 bottles of wine per week   Drug use: No   Sexual activity: Yes    Partners: Female    Tobacco Counseling Counseling given: Not Answered   Immunizations and Health Maintenance Immunization History  Administered Date(s) Administered   Fluad Trivalent(High Dose 65+) 10/14/2023   Hepatitis A, Adult 02/07/2018   Influenza Whole 07/18/2008   Influenza,inj,Quad PF,6+ Mos 07/22/2015, 08/08/2017, 08/08/2018, 10/02/2018, 11/22/2019   Influenza-Unspecified 12/01/2016, 08/08/2017, 10/24/2019, 08/18/2020   PFIZER(Purple Top)SARS-COV-2 Vaccination 01/18/2020, 02/15/2020, 06/30/2020   PNEUMOCOCCAL CONJUGATE-20 10/14/2023   Td 11/06/2010   Tdap 04/23/2019   Typhoid Live 02/07/2018   Unspecified SARS-COV-2 Vaccination 07/19/2023   Zoster Recombinant(Shingrix) 01/20/2017, 02/07/2018   Zoster, Live 07/22/2015   There are no preventive care reminders to display for this patient.   Activities of Daily Living    10/14/2023   11:08 AM  In your present state of health, do you have any difficulty performing the following activities:  Hearing? 0  Vision? 0  Difficulty concentrating or making decisions? 0  Walking or climbing stairs? 0  Dressing or bathing? 0  Doing errands, shopping? 0    Physical Exam   Physical Exam Vitals and nursing note reviewed.  Constitutional:      General: He is not in acute distress.    Appearance: Normal appearance. He is well-developed.  HENT:     Head: Normocephalic and atraumatic.     Right Ear: Tympanic membrane, ear canal and external ear normal. There is no impacted cerumen.     Left Ear: Tympanic membrane, ear canal and external ear normal. There is no impacted cerumen.     Nose: Nose normal.     Mouth/Throat:     Mouth: Mucous membranes are moist.     Pharynx: Oropharynx is clear. No oropharyngeal exudate or posterior oropharyngeal erythema.  Eyes:     General: No scleral icterus.       Right eye: No discharge.         Left eye: No discharge.     Conjunctiva/sclera: Conjunctivae normal.     Pupils: Pupils are equal, round, and reactive to light.  Neck:     Thyroid: No thyromegaly.     Vascular: No JVD.  Cardiovascular:     Rate and Rhythm: Normal rate and regular rhythm.     Heart sounds: Normal heart sounds. No murmur heard. Pulmonary:     Effort: Pulmonary effort is normal. No respiratory distress.     Breath sounds: Normal breath sounds.  Abdominal:     General: Bowel sounds are normal. There is no distension.     Palpations: Abdomen is soft. There is  no mass.     Tenderness: There is no abdominal tenderness. There is no guarding or rebound.  Musculoskeletal:        General: Normal range of motion.     Cervical back: Normal range of motion and neck supple.     Right lower leg: No edema.     Left lower leg: No edema.  Lymphadenopathy:     Cervical: No cervical adenopathy.  Skin:    General: Skin is warm and dry.     Findings: No erythema or rash.  Neurological:     Mental Status: He is alert and oriented to person, place, and time.     Cranial Nerves: No cranial nerve deficit.     Motor: No abnormal muscle tone.     Deep Tendon Reflexes: Reflexes are normal and symmetric. Reflexes normal.  Psychiatric:        Mood and Affect: Mood normal.        Behavior: Behavior normal.        Thought Content: Thought content normal.        Judgment: Judgment normal.    (optional), or other factors deemed appropriate based on the beneficiary's medical and social history and current clinical standards.   Advanced Directives:pt did not need any information      EKG:  Inc RBBB, sinus      Assessment:    This is a routine wellness  examination for this patient .   Vision/Hearing screen Vision Screening   Right eye Left eye Both eyes  Without correction 20/20 20/30 20/20   With correction     Hearing Screening - Comments:: Normal whisper    Goals   None      Depression Screen     10/14/2023   10:39 AM 03/29/2022   10:57 AM 12/09/2020   10:04 AM 11/22/2019   10:25 AM  PHQ 2/9 Scores  PHQ - 2 Score 0 0 0 0     Fall Risk    10/14/2023   10:39 AM  Fall Risk   Falls in the past year? 0  Number falls in past yr: 0  Injury with Fall? 0  Follow up Falls evaluation completed    Cognitive Function    10/14/2023   11:09 AM  MMSE - Mini Mental State Exam  Orientation to time 5  Orientation to Place 5  Registration 3  Attention/ Calculation 5  Recall 3  Language- name 2 objects 2  Language- repeat 1  Language- follow 3 step command 3  Language- read & follow direction 1  Write a sentence 1  Copy design 1  Total score 30        Patient Care Team: [name], DO as PCP - General Nancy Fetter, MD as Referring Physician (Orthopedic Surgery) Revonda Humphrey, MD (Internal Medicine)     Plan:     I have personally reviewed and noted the following in the patient's chart:   Medical and social history Use of alcohol, tobacco or illicit drugs  Current medications and supplements Functional ability and status Nutritional status Physical activity Advanced directives List of other physicians Hospitalizations, surgeries, and ER visits in previous 12 months Vitals Screenings to include cognitive, depression, and falls Referrals and appointments  In addition, I have reviewed and discussed with patient certain preventive protocols, quality metrics, and best practice recommendations. A written personalized care plan for preventive services as well as general preventive health recommendations were provided to patient. Assessment and Plan  Erectile Dysfunction Sildenafil has been managing his erectile dysfunction effectively, and he is willing to continue its use if available. We discussed the potential benefits and side effects, such as headache, flushing, and possible drug interactions. We will refill the sildenafil prescription.  Low  Testosterone He has not used testosterone therapy for a year, previously managing low testosterone with it. We reviewed the benefits, including improved energy and libido, and the risks, particularly cardiovascular concerns. We will re-evaluate his testosterone levels and discuss possibly resuming testosterone therapy.  Hypertension Lisinopril has been effective in managing his hypertension. The importance of medication adherence to control blood pressure and minimize cardiovascular risk was emphasized. We will refill the lisinopril prescription.  Hyperlipidemia Rosuvastatin has been managing his hyperlipidemia. We discussed the critical role of medication adherence in cholesterol management and cardiovascular risk reduction. We will refill the rosuvastatin prescription.  General Health Maintenance As part of routine health maintenance for a 67 year old on Medicare, we discussed vaccinations and screenings. He received the updated COVID vaccine in late September/early October and understood the importance of flu and pneumonia vaccines. We will administer the flu shot and pneumonia vaccine, perform an EKG, and check his vision.  Follow-up We will schedule a follow-up visit as needed.          Lelon Perla Chase, DO 10/14/2023

## 2023-10-15 LAB — TESTOSTERONE TOTAL,FREE,BIO, MALES
Albumin: 4.7 g/dL (ref 3.6–5.1)
Sex Hormone Binding: 27 nmol/L (ref 22–77)
Testosterone, Bioavailable: 143.5 ng/dL (ref 110.0–575.0)
Testosterone, Free: 67 pg/mL (ref 46.0–224.0)
Testosterone: 437 ng/dL (ref 250–827)

## 2023-10-17 ENCOUNTER — Encounter: Payer: Self-pay | Admitting: Family Medicine

## 2023-10-20 ENCOUNTER — Encounter: Payer: Self-pay | Admitting: Family Medicine

## 2023-10-20 DIAGNOSIS — N529 Male erectile dysfunction, unspecified: Secondary | ICD-10-CM

## 2023-10-20 DIAGNOSIS — I10 Essential (primary) hypertension: Secondary | ICD-10-CM

## 2023-10-20 DIAGNOSIS — E785 Hyperlipidemia, unspecified: Secondary | ICD-10-CM

## 2023-10-20 MED ORDER — ROSUVASTATIN CALCIUM 20 MG PO TABS: 20.0000 mg | ORAL_TABLET | Freq: Every day | ORAL | 1 refills | Status: AC

## 2023-10-20 MED ORDER — SILDENAFIL CITRATE 100 MG PO TABS
50.0000 mg | ORAL_TABLET | Freq: Every day | ORAL | 3 refills | Status: DC | PRN
Start: 2023-10-20 — End: 2024-08-06

## 2023-10-20 MED ORDER — LISINOPRIL 20 MG PO TABS: 20.0000 mg | ORAL_TABLET | Freq: Every day | ORAL | 1 refills | Status: AC

## 2023-11-01 ENCOUNTER — Ambulatory Visit (HOSPITAL_COMMUNITY)
Admission: RE | Admit: 2023-11-01 | Discharge: 2023-11-01 | Disposition: A | Discharge status: Home / Self Care | Payer: Medicare Other | Source: Ambulatory Visit | Attending: Family Medicine | Admitting: Family Medicine

## 2023-11-01 DIAGNOSIS — I4510 Unspecified right bundle-branch block: Secondary | ICD-10-CM

## 2023-11-01 DIAGNOSIS — R9431 Abnormal electrocardiogram [ECG] [EKG]: Secondary | ICD-10-CM | POA: Diagnosis present

## 2023-11-01 DIAGNOSIS — I10 Essential (primary) hypertension: Secondary | ICD-10-CM

## 2023-11-01 DIAGNOSIS — E785 Hyperlipidemia, unspecified: Secondary | ICD-10-CM | POA: Diagnosis not present

## 2023-11-01 LAB — ECHOCARDIOGRAM COMPLETE
Area-P 1/2: 4.15 cm2
Calc EF: 57.9 %
S' Lateral: 3.7 cm
Single Plane A2C EF: 60.2 %
Single Plane A4C EF: 59.9 %

## 2023-11-06 ENCOUNTER — Encounter: Payer: Self-pay | Admitting: Family Medicine

## 2024-05-23 ENCOUNTER — Encounter: Payer: Self-pay | Admitting: Family Medicine

## 2024-05-23 ENCOUNTER — Other Ambulatory Visit: Payer: Self-pay | Admitting: Family Medicine

## 2024-05-23 DIAGNOSIS — M719 Bursopathy, unspecified: Secondary | ICD-10-CM

## 2024-05-23 MED ORDER — MELOXICAM 15 MG PO TABS: 15.0000 mg | ORAL_TABLET | Freq: Every day | ORAL | 1 refills | Status: DC

## 2024-05-29 ENCOUNTER — Telehealth (INDEPENDENT_AMBULATORY_CARE_PROVIDER_SITE_OTHER): Payer: PRIVATE HEALTH INSURANCE | Admitting: Family Medicine

## 2024-05-29 DIAGNOSIS — M719 Bursopathy, unspecified: Secondary | ICD-10-CM | POA: Insufficient documentation

## 2024-05-29 MED ORDER — MELOXICAM 15 MG PO TABS: 15.0000 mg | ORAL_TABLET | Freq: Every day | ORAL | 1 refills | Status: AC

## 2024-05-29 NOTE — Assessment & Plan Note (Signed)
Mobic 15mg daily

## 2024-05-29 NOTE — Progress Notes (Signed)
 MyChart Video Visit    Virtual Visit via Video Note   This patient is at least at moderate risk for complications without adequate follow up. This format is felt to be most appropriate for this patient at this time. Physical exam was limited by quality of the video and audio technology used for the visit. Joseph [name] was able to get the patient set up on a video visit.  Patient location: [place] Patient and provider in visit Provider location: Office  I discussed the limitations of evaluation and management by telemedicine and the availability of in person appointments. The patient expressed understanding and agreed to proceed.  Visit Date: 05/29/2024  Today's healthcare provider: Jamee JONELLE Antonio [name], Joseph [name]     Subjective:    Patient ID: Joseph [name], male    DOB: Feb 23, 1956, 68 y.o.   MRN: [id number]  No chief complaint on file.   HPI Patient is in today for refill mobic .  Discussed the use of AI scribe software for clinical note transcription with the patient, who gave verbal consent to proceed.  History of Present Illness Joseph [name] is a 68 year old male with chronic bursitis who presents for prescription renewal.  He has been experiencing bursitis in his right elbow for approximately ten years, with symptoms worsening over time. The condition exacerbates with physical activity, such as using a hammer or working out in the gym, leading to significant swelling.  Approximately six months ago, after working on a house in [place], he developed a golf ball-sized swelling in the right elbow. He was previously prescribed meloxicam  15 mg, which he used intermittently. The medication provided immediate relief, but he did not use it regularly.  Recently, he resumed taking meloxicam  due to soreness from daily gym workouts, and the pain subsided within two days. He currently has no pain or swelling in the elbow.  He uses the [facility] in [place], [place]  for his  medications.  No current pain or swelling in the right elbow.    Past Medical History:  Diagnosis Date   Hyperlipemia    Hypertension     Past Surgical History:  Procedure Laterality Date   EXPLORATORY LAPAROTOMY     for lacerated liver in high school   REFRACTIVE SURGERY     REPLACEMENT TOTAL KNEE BILATERAL Bilateral 01/2020   a few weeks apart---  Dr viann --- [facility]    Family History  Problem Relation Age of Onset   Arthritis Mother    Alzheimer's disease Mother        Living   Cancer Mother 38       colon   Pancreatic cancer Father 46       Deceased   Arthritis Father    Psoriasis Father    Allergic Disorder Sister    Healthy Brother    Colon cancer Other    Rheum arthritis Son     Social History   Socioeconomic History   Marital status: Married    Spouse name: Not on file   Number of children: 4   Years of education: Not on file   Highest education level: Not on file  Occupational History   Not on file  Tobacco Use   Smoking status: Never   Smokeless tobacco: Never  Substance and Sexual Activity   Alcohol use: Yes    Alcohol/week: 2.0 standard drinks of alcohol    Types: 2 Glasses of wine per week    Comment: daily - 2-3  bottles of wine per week   Drug use: No   Sexual activity: Yes    Partners: Female  Other Topics Concern   Not on file  Social History Narrative   Exercise -no   Lives with wife in a two story home.  Has 4 children.     Works as an Art gallery manager.     Education: college   Social Drivers of Corporate investment banker Strain: Low Risk  (08/13/2023)   Received from [facility] and Its Subsidiaries and Affiliates   Overall Financial Resource Strain (CARDIA)    Difficulty of Paying Living Expenses: Not hard at all  Food Insecurity: No Food Insecurity (08/13/2023)   Received from [facility] and Its Subsidiaries and Affiliates   Hunger Vital Sign    Within the past 12 months, you worried that your food would  run out before you got the money to buy more.: Never true    Within the past 12 months, the food you bought just didn't last and you didn't have money to get more.: Never true  Transportation Needs: Not on file  Physical Activity: Insufficiently Active (08/13/2023)   Received from [facility] and Its Subsidiaries and Affiliates   Exercise Vital Sign    On average, how many days per week Joseph [name] you engage in moderate to strenuous exercise (like a brisk walk)?: 2 days    On average, how many minutes Joseph [name] you engage in exercise at this level?: 20 min  Stress: No Stress Concern Present (08/13/2023)   Received from [facility] and Its Subsidiaries and [employer] of Occupational [facility] - Occupational Stress Questionnaire    Feeling of Stress : Not at all  Social Connections: Not on file  Intimate Partner Violence: Not on file    Outpatient Medications Prior to Visit  Medication Sig Dispense Refill   aspirin 81 MG tablet Take 81 mg by mouth daily.     lisinopril  (ZESTRIL ) 20 MG tablet Take 1 tablet (20 mg total) by mouth daily. 90 tablet 1   Omega-3 Fatty Acids (FISH OIL) 1000 MG CAPS Take by mouth daily.     rosuvastatin  (CRESTOR ) 20 MG tablet Take 1 tablet (20 mg total) by mouth daily. 90 tablet 1   sildenafil  (VIAGRA ) 100 MG tablet Take 0.5-1 tablets (50-100 mg total) by mouth daily as needed for erectile dysfunction. 35 tablet 3   meloxicam  (MOBIC ) 15 MG tablet Take 1 tablet (15 mg total) by mouth daily. 90 tablet 1   No facility-administered medications prior to visit.    No Known Allergies  Review of Systems  Constitutional:  Negative for fever and malaise/fatigue.  HENT:  Negative for congestion.   Eyes:  Negative for blurred vision.  Respiratory:  Negative for cough and shortness of breath.   Cardiovascular:  Negative for chest pain, palpitations and leg swelling.  Gastrointestinal:  Negative for vomiting.  Musculoskeletal:  Positive for joint  pain. Negative for back pain.  Skin:  Negative for rash.  Neurological:  Negative for loss of consciousness and headaches.       Objective:    Physical Exam Vitals and nursing note reviewed.  Constitutional:      Appearance: Normal appearance.  Neurological:     Mental Status: He is alert.  Psychiatric:        Mood and Affect: Mood normal.        Behavior: Behavior normal.     There were no  vitals taken for this visit. Wt Readings from Last 3 Encounters:  10/14/23 274 lb 6.4 oz (124.5 kg)  03/29/22 271 lb 6.4 oz (123.1 kg)  12/09/20 267 lb 6.4 oz (121.3 kg)       Assessment & Plan:  Bursitis, unspecified site -     Meloxicam ; Take 1 tablet (15 mg total) by mouth daily.  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 A recent flare-up resolved with intermittent use of meloxicam  15 mg. He is currently asymptomatic with no swelling or pain. Prescribe meloxicam  15 mg as needed for pain. Send prescription to [facility] in [place], [place] .    I discussed the assessment and treatment plan with the patient. The patient was provided an opportunity to ask questions and all were answered. The patient agreed with the plan and demonstrated an understanding of the instructions.   The patient was advised to call back or seek an in-person evaluation if the symptoms worsen or if the condition fails to improve as anticipated.  Joseph JONELLE Antonio [name], Joseph [name] [facility] at [facility] [phone] (phone) [phone] (fax)  [facility]

## 2024-08-05 ENCOUNTER — Other Ambulatory Visit: Payer: Self-pay | Admitting: Family Medicine

## 2024-08-05 DIAGNOSIS — N529 Male erectile dysfunction, unspecified: Secondary | ICD-10-CM
